# Patient Record
Sex: Male | Born: 1990 | Race: Black or African American | Hispanic: No | Marital: Single | Smoking: Former smoker
Health system: Southern US, Community
[De-identification: ages and names within clinical notes are randomized; demographics above are authoritative.]

## PROBLEM LIST (undated history)

## (undated) ENCOUNTER — Emergency Department (HOSPITAL_COMMUNITY): Admission: EM | Discharge status: Absent without leave | Payer: Self-pay

---
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
## 2010-07-27 ENCOUNTER — Ambulatory Visit: Admit: 2010-07-27 | Payer: Self-pay | Admitting: Sports Medicine

## 2012-12-05 ENCOUNTER — Emergency Department (HOSPITAL_COMMUNITY)
Admission: EM | Admit: 2012-12-05 | Discharge: 2012-12-06 | Disposition: A | Discharge status: Home / Self Care | Payer: Self-pay | Attending: Emergency Medicine | Admitting: Emergency Medicine

## 2012-12-05 DIAGNOSIS — J3489 Other specified disorders of nose and nasal sinuses: Secondary | ICD-10-CM | POA: Insufficient documentation

## 2012-12-05 DIAGNOSIS — J302 Other seasonal allergic rhinitis: Secondary | ICD-10-CM

## 2012-12-05 DIAGNOSIS — J309 Allergic rhinitis, unspecified: Secondary | ICD-10-CM | POA: Insufficient documentation

## 2012-12-05 DIAGNOSIS — Z880 Allergy status to penicillin: Secondary | ICD-10-CM | POA: Insufficient documentation

## 2012-12-06 ENCOUNTER — Encounter (HOSPITAL_COMMUNITY): Payer: Self-pay | Admitting: Emergency Medicine

## 2012-12-06 MED ORDER — CETIRIZINE HCL 10 MG PO TABS: 10.0000 mg | ORAL_TABLET | Freq: Every day | ORAL | Status: DC

## 2012-12-06 NOTE — ED Notes (Signed)
Pt states that he began having neck pain 2 weeks ago. Believes it may be a muscle strain. States that he has used heat and aspirin and that helps for awhile.

## 2012-12-06 NOTE — ED Provider Notes (Signed)
History     CSN: [id number]  Arrival date & time 12/05/12  2315   First MD Initiated Contact with Patient 12/06/12 0020      Chief Complaint  Patient presents with  . Neck Pain    (Consider location/radiation/quality/duration/timing/severity/associated sxs/prior treatment) HPI Comments: Patient is a 22 year old male who presents with a 2 week history of sinus congestion and neck pain. Symptoms started gradually and progressively worsened since the onset. Patient reports having a history of seasonal allergies but does not take any medication for them. Patient's neck pain is aching and moderate without radiation. Movement of his neck makes the pain worse. Patient reports the neck pain started when patient started working out. No other associated symptoms. Patient tries aspirin which helped with the pain.   Patient is a 22 y.o. male presenting with neck pain.  Neck Pain   History reviewed. No pertinent past medical history.  History reviewed. No pertinent past surgical history.  No family history on file.  History  Substance Use Topics  . Smoking status: Former Smoker    Quit date: 12/07/2010  . Smokeless tobacco: Never Used  . Alcohol Use: 3.6 oz/week    6 Cans of beer per week      Review of Systems  HENT: Positive for congestion and neck pain.   All other systems reviewed and are negative.    Allergies  Amoxicillin  Home Medications  No current outpatient prescriptions on file.  BP 119/89  Pulse 58  Temp(Src) 98.3 F (36.8 C) (Oral)  Resp 16  Ht 6\' 1"  (1.854 m)  Wt 133 lb 6.4 oz (60.51 kg)  BMI 17.6 kg/m2  SpO2 100%  Physical Exam  Nursing note and vitals reviewed. Constitutional: He is oriented to person, place, and time. He appears well-developed and well-nourished. No distress.  HENT:  Head: Normocephalic and atraumatic.  Mouth/Throat: Oropharynx is clear and moist. No oropharyngeal exudate.  No maxillary, frontal or ethmoid sinus tenderness to  palpation.   Eyes: Conjunctivae and EOM are normal. Pupils are equal, round, and reactive to light.  Neck: Normal range of motion.  Cardiovascular: Normal rate and regular rhythm.  Exam reveals no gallop and no friction rub.   No murmur heard. Pulmonary/Chest: Effort normal and breath sounds normal. He has no wheezes. He has no rales. He exhibits no tenderness.  Abdominal: Soft. There is no tenderness.  Musculoskeletal: Normal range of motion.  Neurological: He is alert and oriented to person, place, and time. Coordination normal.  Speech is goal-oriented. Moves limbs without ataxia.   Skin: Skin is warm and dry.  Psychiatric: He has a normal mood and affect. His behavior is normal.    ED Course  Procedures (including critical care time)  Labs Reviewed - No data to display No results found.   1. Seasonal allergies       MDM  12:37 AM Patient likely has nasal congestion from seasonal allergies. Patient is afebrile and denies any other symptoms. Vitals stable. Patient will have zyrtec prescription. Patient instructed to return with worsening or concerning symptoms.   [employer], PA-C 12/06/12 0100

## 2012-12-09 NOTE — ED Provider Notes (Signed)
Medical screening examination/treatment/procedure(s) were performed by non-physician practitioner and as supervising physician I was immediately available for consultation/collaboration.  Christopher J. Pollina, MD 12/09/12 0732 

## 2014-01-06 ENCOUNTER — Encounter (HOSPITAL_COMMUNITY): Payer: Self-pay | Admitting: Emergency Medicine

## 2014-01-06 ENCOUNTER — Emergency Department (HOSPITAL_COMMUNITY)
Admission: EM | Admit: 2014-01-06 | Discharge: 2014-01-06 | Disposition: A | Discharge status: Home / Self Care | Payer: Self-pay | Attending: Emergency Medicine | Admitting: Emergency Medicine

## 2014-01-06 ENCOUNTER — Emergency Department (HOSPITAL_COMMUNITY): Payer: Self-pay

## 2014-01-06 DIAGNOSIS — R059 Cough, unspecified: Secondary | ICD-10-CM | POA: Insufficient documentation

## 2014-01-06 DIAGNOSIS — R109 Unspecified abdominal pain: Secondary | ICD-10-CM | POA: Insufficient documentation

## 2014-01-06 DIAGNOSIS — Z87891 Personal history of nicotine dependence: Secondary | ICD-10-CM | POA: Insufficient documentation

## 2014-01-06 DIAGNOSIS — R079 Chest pain, unspecified: Secondary | ICD-10-CM

## 2014-01-06 DIAGNOSIS — R05 Cough: Secondary | ICD-10-CM | POA: Insufficient documentation

## 2014-01-06 DIAGNOSIS — R0789 Other chest pain: Secondary | ICD-10-CM | POA: Insufficient documentation

## 2014-01-06 DIAGNOSIS — Z880 Allergy status to penicillin: Secondary | ICD-10-CM | POA: Insufficient documentation

## 2014-01-06 LAB — CBC WITH DIFFERENTIAL/PLATELET
Basophils Absolute: 0.1 10*3/uL (ref 0.0–0.1)
Basophils Relative: 1 % (ref 0–1)
Eosinophils Absolute: 0.1 10*3/uL (ref 0.0–0.7)
Eosinophils Relative: 2 % (ref 0–5)
HCT: 44.4 % (ref 39.0–52.0)
Hemoglobin: 15 g/dL (ref 13.0–17.0)
LYMPHS PCT: 42 % (ref 12–46)
Lymphs Abs: 2.6 10*3/uL (ref 0.7–4.0)
MCH: 29 pg (ref 26.0–34.0)
MCHC: 33.8 g/dL (ref 30.0–36.0)
MCV: 85.7 fL (ref 78.0–100.0)
Monocytes Absolute: 0.4 10*3/uL (ref 0.1–1.0)
Monocytes Relative: 6 % (ref 3–12)
NEUTROS PCT: 49 % (ref 43–77)
Neutro Abs: 3 10*3/uL (ref 1.7–7.7)
PLATELETS: 196 10*3/uL (ref 150–400)
RBC: 5.18 MIL/uL (ref 4.22–5.81)
RDW: 13.6 % (ref 11.5–15.5)
WBC: 6.1 10*3/uL (ref 4.0–10.5)

## 2014-01-06 LAB — COMPREHENSIVE METABOLIC PANEL
ALT: 10 U/L (ref 0–53)
AST: 19 U/L (ref 0–37)
Albumin: 4.3 g/dL (ref 3.5–5.2)
Alkaline Phosphatase: 87 U/L (ref 39–117)
BUN: 8 mg/dL (ref 6–23)
CHLORIDE: 101 meq/L (ref 96–112)
CO2: 23 meq/L (ref 19–32)
Calcium: 9.5 mg/dL (ref 8.4–10.5)
Creatinine, Ser: 0.92 mg/dL (ref 0.50–1.35)
GFR calc Af Amer: >90 mL/min (ref >90)
Glucose, Bld: 81 mg/dL (ref 70–99)
POTASSIUM: 4 meq/L (ref 3.7–5.3)
SODIUM: 138 meq/L (ref 137–147)
Total Bilirubin: 0.5 mg/dL (ref 0.3–1.2)
Total Protein: 7.1 g/dL (ref 6.0–8.3)

## 2014-01-06 LAB — LIPASE, BLOOD: Lipase: 38 U/L (ref 11–59)

## 2014-01-06 MED ORDER — IBUPROFEN 600 MG PO TABS: 600.0000 mg | ORAL_TABLET | Freq: Four times a day (QID) | ORAL | Status: DC | PRN

## 2014-01-06 MED ORDER — IBUPROFEN 200 MG PO TABS
600.0000 mg | ORAL_TABLET | Freq: Once | ORAL | Status: AC
  Administered 2014-01-06: 600 mg via ORAL
  Filled 2014-01-06: qty 3

## 2014-01-06 NOTE — ED Notes (Signed)
Pt states he started having upper abd pain last night after eating a bowl of hot oatmeal and then today he is having a tightness in his chest that comes and goes  Pt states the pain is under his rib cage mainly on the left side  Pt describes it as a tightness  Pt denies any other sxs associated with the pain  No nausea or vomiting

## 2014-01-06 NOTE — Progress Notes (Signed)
  CARE MANAGEMENT ED NOTE 01/06/2014  Patient:  Victor [name] [name],Victor [name] [name]   Account Number:  [id number]  Date Initiated:  01/06/2014  Documentation initiated by:  Radford PaxFERRERO,AMY  Subjective/Objective Assessment:   patient presents to Ed with abdominal pain and chest tightness.     Subjective/Objective Assessment Detail:     Action/Plan:   Action/Plan Detail:   Anticipated DC Date:       Status Recommendation to Physician:   Result of Recommendation:    Other ED Services  Consult Working Plan    DC Planning Services  Other  PCP issues    Choice offered to / List presented to:            Status of service:  Completed, signed off  ED Comments:   ED Comments Detail:  EDCM spoke to patient at bedside.  Patient confirms he does not have [name] pcp or [place]. [facility] provided [facility] with [name] list of pcps who accept self pay patients, list of discount pharmacies and websites needymeds.org and Good https://figueroa.info/X.com for medication assistance, list of financial resources in the community such as local churches and [employer],  urban ministries, phone number ot inquire about the orange card and DSS for Medicaid, and dental assistance for patients without insurance.  [facility]. [facility] informed patient that walkins are welcome there from Mon-Thurs from 9-1030am .  Also informed patient that he may enroll for [facility] and receive assist with his medications if needed.  Patient thankful for resources. No further EDCM needs at this time.

## 2014-01-06 NOTE — Discharge Instructions (Signed)

## 2014-01-06 NOTE — ED Provider Notes (Signed)
CSN: [id number]     Arrival date & time 01/06/14  1947 History   First MD Initiated Contact with Patient 01/06/14 2047     Chief Complaint  Patient presents with  . Chest Pain     (Consider location/radiation/quality/duration/timing/severity/associated sxs/prior Treatment) HPI 23 year old male presents with left-sided chest pain intermittently for the past month. He states that today he also noticed left upper abdominal pain that is worse with motion and twisting  The chest pain steadily worse when lying flat. It is worse with deep inspiration. It is a sharp and cramping pain. Has not tried anything for the pain. Has a cough but states he's always had a cough. Denies any hemoptysis, leg swelling, DVT history, or recent travel. No exertional symptoms. No nausea or vomiting.  History reviewed. No pertinent past medical history. History reviewed. No pertinent past surgical history. History reviewed. No pertinent family history. History  Substance Use Topics  . Smoking status: Former Smoker    Quit date: 12/07/2010  . Smokeless tobacco: Never Used  . Alcohol Use: No    Review of Systems  Constitutional: Negative for fever.  Respiratory: Positive for cough (non productive - chronic). Negative for shortness of breath.   Cardiovascular: Positive for chest pain. Negative for leg swelling.  Gastrointestinal: Positive for abdominal pain. Negative for vomiting.  All other systems reviewed and are negative.     Allergies  Amoxicillin  Home Medications   Prior to Admission medications   Medication Sig Start Date End Date Taking? Authorizing Provider  ibuprofen (ADVIL,MOTRIN) 200 MG tablet Take 200 mg by mouth every 6 (six) hours as needed for moderate pain.   Yes Historical Provider, MD  Multiple Vitamin (MULTIVITAMIN WITH MINERALS) TABS tablet Take 1 tablet by mouth daily.   Yes Historical Provider, MD   BP 128/65  Pulse 74  Temp(Src) 97.8 F (36.6 C) (Oral)  Resp 13  Ht 6'  (1.829 m)  Wt 133 lb (60.328 kg)  BMI 18.03 kg/m2  SpO2 100% Physical Exam  Nursing note and vitals reviewed. Constitutional: He is oriented to person, place, and time. He appears well-developed and well-nourished. No distress.  HENT:  Head: Normocephalic and atraumatic.  Right Ear: External ear normal.  Left Ear: External ear normal.  Nose: Nose normal.  Eyes: Right eye exhibits no discharge. Left eye exhibits no discharge.  Neck: Neck supple.  Cardiovascular: Normal rate, regular rhythm, normal heart sounds and intact distal pulses.   Pulmonary/Chest: Effort normal and breath sounds normal. He has no wheezes. He has no rales. He exhibits no tenderness.  Abdominal: Soft. He exhibits no distension. There is no tenderness.  Musculoskeletal: He exhibits no edema.  Neurological: He is alert and oriented to person, place, and time.  Skin: Skin is warm and dry.    ED Course  Procedures (including critical care time) Labs Review Labs Reviewed  CBC WITH DIFFERENTIAL  COMPREHENSIVE METABOLIC PANEL  LIPASE, BLOOD    Imaging Review Dg Chest 2 View  01/06/2014   CLINICAL DATA:  Chest pain and shortness of breath.  EXAM: CHEST  2 VIEW  COMPARISON:  None.  FINDINGS: Pulmonary hyperinflation, likely due to asthma. The heart size and mediastinal contours are within normal limits. Both lungs are clear. The visualized skeletal structures are unremarkable.  IMPRESSION: No active cardiopulmonary disease.   Electronically Signed   By: Burman NievesWilliam  Stevens M.D.   On: 01/06/2014 21:28     EKG Interpretation   Date/Time:  Wednesday January 06 2014 20:02:21  EDT Ventricular Rate:  84 PR Interval:  124 QRS Duration: 95 QT Interval:  342 QTC Calculation: 404 R Axis:   -3 Text Interpretation:  Sinus rhythm RAE, consider biatrial enlargement RSR'  in V1 or V2, probably normal variant ST elevation suggests acute  pericarditis ST elevation appears similar to 2009 Confirmed by GOLDSTON   MD, SCOTT ([id number])  on 01/06/2014 8:50:21 PM      MDM   Final diagnoses:  Chest pain, unspecified chest pain type    Patient's pain is very atypical, his symptoms resolved completely with one dose of ibuprofen in the ED. Patient is low risk for pulmonary embolism and has a negative PERC. Not c/w ACS. Abdominal pain is of unk etiology, no abd tenderness now and normal labs. Will treat with NSAIDs and recommend PCP f/u.    Audree CamelScott T Goldston, MD 01/07/14 [phone]

## 2014-01-06 NOTE — ED Notes (Signed)
Patient transported to X-ray 

## 2014-10-08 ENCOUNTER — Emergency Department (HOSPITAL_COMMUNITY): Payer: Self-pay

## 2014-10-08 ENCOUNTER — Encounter (HOSPITAL_COMMUNITY): Payer: Self-pay | Admitting: Emergency Medicine

## 2014-10-08 ENCOUNTER — Emergency Department (HOSPITAL_COMMUNITY)
Admission: EM | Admit: 2014-10-08 | Discharge: 2014-10-08 | Disposition: A | Discharge status: Home / Self Care | Payer: Self-pay | Attending: Emergency Medicine | Admitting: Emergency Medicine

## 2014-10-08 DIAGNOSIS — Z880 Allergy status to penicillin: Secondary | ICD-10-CM | POA: Insufficient documentation

## 2014-10-08 DIAGNOSIS — R079 Chest pain, unspecified: Secondary | ICD-10-CM | POA: Insufficient documentation

## 2014-10-08 DIAGNOSIS — Z79899 Other long term (current) drug therapy: Secondary | ICD-10-CM | POA: Insufficient documentation

## 2014-10-08 DIAGNOSIS — R109 Unspecified abdominal pain: Secondary | ICD-10-CM | POA: Insufficient documentation

## 2014-10-08 DIAGNOSIS — Z87891 Personal history of nicotine dependence: Secondary | ICD-10-CM | POA: Insufficient documentation

## 2014-10-08 LAB — CBC
HEMATOCRIT: 42.6 % (ref 39.0–52.0)
Hemoglobin: 14.4 g/dL (ref 13.0–17.0)
MCH: 29.3 pg (ref 26.0–34.0)
MCHC: 33.8 g/dL (ref 30.0–36.0)
MCV: 86.6 fL (ref 78.0–100.0)
Platelets: 195 10*3/uL (ref 150–400)
RBC: 4.92 MIL/uL (ref 4.22–5.81)
RDW: 13.6 % (ref 11.5–15.5)
WBC: 5 10*3/uL (ref 4.0–10.5)

## 2014-10-08 LAB — BASIC METABOLIC PANEL
Anion gap: 6 (ref 5–15)
BUN: 8 mg/dL (ref 6–23)
CO2: 27 mmol/L (ref 19–32)
Calcium: 9.3 mg/dL (ref 8.4–10.5)
Chloride: 106 mmol/L (ref 96–112)
Creatinine, Ser: 0.96 mg/dL (ref 0.50–1.35)
GFR calc non Af Amer: >90 mL/min (ref >90)
Glucose, Bld: 87 mg/dL (ref 70–99)
POTASSIUM: 3.7 mmol/L (ref 3.5–5.1)
SODIUM: 139 mmol/L (ref 135–145)

## 2014-10-08 LAB — I-STAT TROPONIN, ED: TROPONIN I, POC: 0.01 ng/mL (ref 0.00–0.08)

## 2014-10-08 MED ORDER — OMEPRAZOLE 20 MG PO CPDR: 20.0000 mg | DELAYED_RELEASE_CAPSULE | Freq: Every day | ORAL | Status: DC

## 2014-10-08 NOTE — Progress Notes (Signed)
24 [place] c/o intermittent fatigue x 3 days, abdominal pain, neck pain, chest pain, neck pain. Denies emesis or diarrhea.  No pcp Spoke with pt who confirmed no pcp  CM spoke with pt who [place] pcp. CM discussed and provided written information for self pay pcps, importance of pcp for f/u care, www.needymeds.org, www.goodrx.com, discounted pharmacies and other [place] resources such as [facility], [facility], affordable care act,  financial assistance, DSS and  health department  Reviewed resources for [place] self pay pcps like [facility], [facility] medicine at [address], [facility], general medical clinics, [facility] plus others, medication resources, [facility] out patient [facility]ies and housing Pt voiced understanding and appreciation of resources provided   Provided [facility] contact information Agreed to referral CM completed Referral

## 2014-10-08 NOTE — Discharge Instructions (Signed)

## 2014-10-08 NOTE — ED Notes (Signed)
Pt c/o intermittent fatigue x 3 days, abdominal pain, neck pain, chest pain, neck pain. Denies emesis or diarrhea.

## 2014-10-08 NOTE — ED Provider Notes (Signed)
CSN: [id number]     Arrival date & time 10/08/14  1516 History   First MD Initiated Contact with Patient 10/08/14 1551     Chief Complaint  Patient presents with  . Fatigue  . Chest Pain  . Abdominal Pain     (Consider location/radiation/quality/duration/timing/severity/associated sxs/prior Treatment) Patient is a 24 y.o. male presenting with chest pain and abdominal pain. The history is provided by the patient.  Chest Pain Associated symptoms: abdominal pain   Associated symptoms: no back pain, no headache, no nausea, no numbness, no shortness of breath, not vomiting and no weakness   Abdominal Pain Associated symptoms: chest pain   Associated symptoms: no diarrhea, no dysuria, no nausea, no shortness of breath and no vomiting    patient presents with multiple complaints. States he has pain in his upper chest and shoulders. Is worse with sitting up. It is worse after eating. He states will have the taste of acid in his throat sometimes. No fevers. States his been going on for last few days. Also will have some lower abdominal pain at times. No weight loss or weight gain. He does not smoke cigarettes but does smoke marijuana. States it may have been worse since he has had a new strain over the last few days. States he also feels foggy headed at time.  History reviewed. No pertinent past medical history. History reviewed. No pertinent past surgical history. No family history on file. History  Substance Use Topics  . Smoking status: Former Smoker    Quit date: 12/07/2010  . Smokeless tobacco: Never Used  . Alcohol Use: No    Review of Systems  Constitutional: Negative for activity change and appetite change.  Eyes: Negative for pain.  Respiratory: Negative for chest tightness and shortness of breath.   Cardiovascular: Positive for chest pain. Negative for leg swelling.  Gastrointestinal: Positive for abdominal pain. Negative for nausea, vomiting and diarrhea.  Genitourinary:  Negative for dysuria, flank pain, penile swelling, penile pain and testicular pain.  Musculoskeletal: Negative for back pain and neck stiffness.  Skin: Negative for rash.  Neurological: Negative for weakness, numbness and headaches.  Psychiatric/Behavioral: Negative for behavioral problems.      Allergies  Amoxicillin  Home Medications   Prior to Admission medications   Medication Sig Start Date End Date Taking? Authorizing Provider  loratadine (CLARITIN) 10 MG tablet Take 10 mg by mouth daily as needed for allergies (allergies).   Yes Historical Provider, MD  Multiple Vitamin (MULTIVITAMIN WITH MINERALS) TABS tablet Take 1 tablet by mouth daily.   Yes Historical Provider, MD  ibuprofen (ADVIL,MOTRIN) 600 MG tablet Take 1 tablet (600 mg total) by mouth every 6 (six) hours as needed. Patient not taking: Reported on 10/08/2014 01/06/14   Pricilla LovelessScott Goldston, MD  omeprazole (PRILOSEC) 20 MG capsule Take 1 capsule (20 mg total) by mouth daily. 10/08/14   Benjiman CoreNathan Vihaan Gloss, MD   BP 121/64 mmHg  Pulse 64  Temp(Src) 97.9 F (36.6 C) (Oral)  Resp 18  SpO2 98% Physical Exam  Constitutional: He is oriented to person, place, and time. He appears well-developed and well-nourished.  HENT:  Head: Normocephalic and atraumatic.  Eyes: Pupils are equal, round, and reactive to light.  Neck: Normal range of motion. Neck supple.  Cardiovascular: Normal rate, regular rhythm and normal heart sounds.   No murmur heard. Pulmonary/Chest: Effort normal.  Abdominal: Soft. Bowel sounds are normal. He exhibits no distension. There is no tenderness.  Musculoskeletal: Normal range of motion. He exhibits  no edema.  Neurological: He is alert and oriented to person, place, and time. No cranial nerve deficit.  Skin: Skin is warm and dry.  Psychiatric: He has a normal mood and affect.  Nursing note and vitals reviewed.   ED Course  Procedures (including critical care time) Labs Review Labs Reviewed  CBC  BASIC  METABOLIC PANEL  Rosezena Sensor, ED    Imaging Review Dg Chest 2 View  10/08/2014   CLINICAL DATA:  Chest pain, fatigue  EXAM: CHEST  2 VIEW  COMPARISON:  01/06/2014  FINDINGS: Cardiomediastinal silhouette is stable. No acute infiltrate or pleural effusion. No pulmonary edema. Bony thorax is unremarkable.  IMPRESSION: No active cardiopulmonary disease.   Electronically Signed   By: Natasha Mead M.D.   On: 10/08/2014 16:21     EKG Interpretation   Date/Time:  Friday October 08 2014 15:28:23 EDT Ventricular Rate:  67 PR Interval:  129 QRS Duration: 101 QT Interval:  378 QTC Calculation: 399 R Axis:   49 Text Interpretation:  Sinus rhythm RSR' in V1 or V2, right VCD or RVH  Nonspecific T abnrm, anterolateral leads ST elev, probable normal early  repol pattern Confirmed by Rubin Payor  MD, Harrold Donath [phone]) on 10/08/2014  4:10:26 PM      MDM   Final diagnoses:  Chest pain, unspecified chest pain type    Patient with chest pain. Also abdominal pain. Vague symptoms. EKG and lab work reassuring. Doubt cardiac cause. Has had some acid taste in his throat that has improved with Tums at home. Will start on Prilosec and have follow-up with a PCP as needed.    Benjiman Core, MD 10/08/14 1758

## 2017-03-17 ENCOUNTER — Emergency Department (HOSPITAL_COMMUNITY)
Admission: EM | Admit: 2017-03-17 | Discharge: 2017-03-18 | Disposition: A | Discharge status: Home / Self Care | Payer: Self-pay | Attending: Emergency Medicine | Admitting: Emergency Medicine

## 2017-03-17 ENCOUNTER — Encounter (HOSPITAL_COMMUNITY): Payer: Self-pay | Admitting: *Deleted

## 2017-03-17 DIAGNOSIS — R112 Nausea with vomiting, unspecified: Secondary | ICD-10-CM | POA: Insufficient documentation

## 2017-03-17 DIAGNOSIS — R1013 Epigastric pain: Secondary | ICD-10-CM | POA: Insufficient documentation

## 2017-03-17 DIAGNOSIS — Z87891 Personal history of nicotine dependence: Secondary | ICD-10-CM | POA: Insufficient documentation

## 2017-03-17 LAB — URINALYSIS, ROUTINE W REFLEX MICROSCOPIC
Bacteria, UA: NONE SEEN
Bilirubin Urine: NEGATIVE
Glucose, UA: NEGATIVE mg/dL
Hgb urine dipstick: NEGATIVE
Ketones, ur: 80 mg/dL — AB
Leukocytes, UA: NEGATIVE
Nitrite: NEGATIVE
Protein, ur: 30 mg/dL — AB
Specific Gravity, Urine: 1.03 (ref 1.005–1.030)
Squamous Epithelial / LPF: NONE SEEN
pH: 5 (ref 5.0–8.0)

## 2017-03-17 LAB — RAPID URINE DRUG SCREEN, HOSP PERFORMED
Amphetamines: NOT DETECTED
BARBITURATES: NOT DETECTED
BENZODIAZEPINES: NOT DETECTED
Cocaine: POSITIVE — AB
Opiates: NOT DETECTED
Tetrahydrocannabinol: POSITIVE — AB

## 2017-03-17 LAB — COMPREHENSIVE METABOLIC PANEL
ALT: 14 U/L — ABNORMAL LOW (ref 17–63)
AST: 28 U/L (ref 15–41)
Albumin: 4.9 g/dL (ref 3.5–5.0)
Alkaline Phosphatase: 74 U/L (ref 38–126)
Anion gap: 10 (ref 5–15)
BUN: 11 mg/dL (ref 6–20)
CO2: 26 mmol/L (ref 22–32)
Calcium: 9.8 mg/dL (ref 8.9–10.3)
Chloride: 103 mmol/L (ref 101–111)
Creatinine, Ser: 1.07 mg/dL (ref 0.61–1.24)
GFR calc Af Amer: >60 mL/min (ref >60)
GFR calc non Af Amer: >60 mL/min (ref >60)
Glucose, Bld: 85 mg/dL (ref 65–99)
Potassium: 4 mmol/L (ref 3.5–5.1)
Sodium: 139 mmol/L (ref 135–145)
Total Bilirubin: 1.2 mg/dL (ref 0.3–1.2)
Total Protein: 7.9 g/dL (ref 6.5–8.1)

## 2017-03-17 LAB — CBC
HCT: 45 % (ref 39.0–52.0)
Hemoglobin: 15.1 g/dL (ref 13.0–17.0)
MCH: 28.9 pg (ref 26.0–34.0)
MCHC: 33.6 g/dL (ref 30.0–36.0)
MCV: 86.2 fL (ref 78.0–100.0)
Platelets: 217 10*3/uL (ref 150–400)
RBC: 5.22 MIL/uL (ref 4.22–5.81)
RDW: 15 % (ref 11.5–15.5)
WBC: 6 10*3/uL (ref 4.0–10.5)

## 2017-03-17 LAB — LIPASE, BLOOD: Lipase: 19 U/L (ref 11–51)

## 2017-03-17 MED ORDER — ONDANSETRON HCL 4 MG/2ML IJ SOLN
4.0000 mg | Freq: Once | INTRAMUSCULAR | Status: AC
  Administered 2017-03-17: 4 mg via INTRAVENOUS
  Filled 2017-03-17: qty 2

## 2017-03-17 MED ORDER — ONDANSETRON 8 MG PO TBDP: 8.0000 mg | ORAL_TABLET | Freq: Three times a day (TID) | ORAL | 0 refills | Status: DC | PRN

## 2017-03-17 MED ORDER — ALUM & MAG HYDROXIDE-SIMETH 200-200-20 MG/5ML PO SUSP
15.0000 mL | Freq: Once | ORAL | Status: AC
  Administered 2017-03-17: 15 mL via ORAL
  Filled 2017-03-17: qty 30

## 2017-03-17 MED ORDER — SODIUM CHLORIDE 0.9 % IV BOLUS (SEPSIS)
1000.0000 mL | Freq: Once | INTRAVENOUS | Status: AC
  Administered 2017-03-17: 1000 mL via INTRAVENOUS

## 2017-03-17 MED ORDER — FAMOTIDINE 20 MG PO TABS
20.0000 mg | ORAL_TABLET | Freq: Once | ORAL | Status: AC
  Administered 2017-03-17: 20 mg via ORAL
  Filled 2017-03-17: qty 1

## 2017-03-17 NOTE — ED Notes (Signed)
Pt does smoke THC, pt attempted to smoke this morning for N/V, pt stated it made the n/v worse. Pt occasionally passes gas, pts last bowel movement this morning and was abnormal, movement came out in small amounts.

## 2017-03-17 NOTE — ED Notes (Signed)
Pt unable to provide urine sample at this time 

## 2017-03-17 NOTE — ED Triage Notes (Signed)
Pt states for last 2 weeks he has had a loss of appetite and nausea. Unable to keep food or water in but able to consume and keep some ETOH yesterday, tried marijuana today for nausea without relief.

## 2017-03-17 NOTE — ED Notes (Signed)
Pt is unable to urinate at this time. 

## 2017-03-17 NOTE — ED Provider Notes (Signed)
[facility]-EMERGENCY DEPT Provider Note   CSN: [id number] Arrival date & time: 03/17/17  1603     History   Chief Complaint Chief Complaint  Patient presents with  . Anorexia    HPI Victor [name] is a 26 y.o. male.  Patient c/o epigastric pain, and nausea/vomiting, intermittently in past 2 weeks. Emesis clear to color recently ingested food, not bloody or bilious. Epigastric pain comes and goes, no specific exacerbating or alleviating factors. Denies back or flank pain. Is having normal bms. Denies hx pud, pancreatitis, or gallstones. Denies dysuria, hematuria or gu c/o. No chest pain or sob. No fever or chills.    The history is provided by the patient.    History reviewed. No pertinent past medical history.  There are no active problems to display for this patient.   History reviewed. No pertinent surgical history.     Home Medications    Prior to Admission medications   Medication Sig Start Date End Date Taking? Authorizing Provider  hydroxypropyl methylcellulose / hypromellose (ISOPTO TEARS / GONIOVISC) 2.5 % ophthalmic solution Place 1 drop into both eyes as needed for dry eyes.   Yes [provider]  ibuprofen (ADVIL,MOTRIN) 600 MG tablet Take 1 tablet (600 mg total) by mouth every 6 (six) hours as needed. Patient not taking: Reported on 10/08/2014 01/06/14   Pricilla Loveless, MD    Family History No family history on file.  Social History Social History  Substance Use Topics  . Smoking status: Former Smoker    Quit date: 12/07/2010  . Smokeless tobacco: Never Used  . Alcohol use Yes     Allergies   Amoxicillin   Review of Systems Review of Systems  Constitutional: Negative for fever.  HENT: Negative for sore throat.   Eyes: Negative for redness.  Respiratory: Negative for shortness of breath.   Cardiovascular: Negative for chest pain.  Gastrointestinal: Positive for abdominal pain and vomiting.  Endocrine: Negative for polyuria.    Genitourinary: Negative for dysuria and flank pain.  Musculoskeletal: Negative for back pain and neck pain.  Skin: Negative for rash.  Neurological: Negative for headaches.  Hematological: Does not bruise/bleed easily.  Psychiatric/Behavioral: Negative for confusion.     Physical Exam Updated Vital Signs BP 114/73 (BP Location: Left Arm)   Pulse (!) 55   Temp 99.3 F (37.4 C) (Oral)   Resp 18   Ht 1.829 m (6')   Wt 61.7 kg (136 lb)   SpO2 100%   BMI 18.44 kg/m   Physical Exam  Constitutional: He appears well-developed and well-nourished. No distress.  HENT:  Mouth/Throat: Oropharynx is clear and moist.  Eyes: Conjunctivae are normal.  Neck: Neck supple. No tracheal deviation present.  Cardiovascular: Normal rate, regular rhythm, normal heart sounds and intact distal pulses.  Exam reveals no gallop and no friction rub.   No murmur heard. Pulmonary/Chest: Effort normal and breath sounds normal. No accessory muscle usage. No respiratory distress.  Abdominal: Soft. Bowel sounds are normal. He exhibits no distension and no mass. There is no tenderness. There is no rebound and no guarding. No hernia.  Genitourinary:  Genitourinary Comments: No cva tenderness  Musculoskeletal: He exhibits no edema.  Neurological: He is alert.  Skin: Skin is warm and dry. No rash noted. He is not diaphoretic.  Psychiatric: He has a normal mood and affect.  Nursing note and vitals reviewed.    ED Treatments / Results  Labs (all labs ordered are listed, but only abnormal results are  displayed) Results for orders placed or performed during the hospital encounter of 03/17/17  Lipase, blood  Result Value Ref Range   Lipase 19 11 - 51 U/L  Comprehensive metabolic panel  Result Value Ref Range   Sodium 139 135 - 145 mmol/L   Potassium 4.0 3.5 - 5.1 mmol/L   Chloride 103 101 - 111 mmol/L   CO2 26 22 - 32 mmol/L   Glucose, Bld 85 65 - 99 mg/dL   BUN 11 6 - 20 mg/dL   Creatinine, Ser 1.611.07  0.61 - 1.24 mg/dL   Calcium 9.8 8.9 - 09.610.3 mg/dL   Total Protein 7.9 6.5 - 8.1 g/dL   Albumin 4.9 3.5 - 5.0 g/dL   AST 28 15 - 41 U/L   ALT 14 (L) 17 - 63 U/L   Alkaline Phosphatase 74 38 - 126 U/L   Total Bilirubin 1.2 0.3 - 1.2 mg/dL   GFR calc non Af Amer >60 >60 mL/min   GFR calc Af Amer >60 >60 mL/min   Anion gap 10 5 - 15  CBC  Result Value Ref Range   WBC 6.0 4.0 - 10.5 K/uL   RBC 5.22 4.22 - 5.81 MIL/uL   Hemoglobin 15.1 13.0 - 17.0 g/dL   HCT 04.545.0 40.939.0 - 81.152.0 %   MCV 86.2 78.0 - 100.0 fL   MCH 28.9 26.0 - 34.0 pg   MCHC 33.6 30.0 - 36.0 g/dL   RDW 91.415.0 78.211.5 - 95.615.5 %   Platelets 217 150 - 400 K/uL    EKG  EKG Interpretation None       Radiology No results found.  Procedures Procedures (including critical care time)  Medications Ordered in ED Medications  sodium chloride 0.9 % bolus 1,000 mL (not administered)  famotidine (PEPCID) tablet 20 mg (not administered)  alum & mag hydroxide-simeth (MAALOX/MYLANTA) 200-200-20 MG/5ML suspension 15 mL (not administered)  ondansetron (ZOFRAN) injection 4 mg (not administered)     Initial Impression / Assessment and Plan / ED Course  I have reviewed the triage vital signs and the nursing notes.  Pertinent labs & imaging results that were available during my care of the patient were reviewed by me and considered in my medical decision making (see chart for details).  Iv ns bolus. zofran iv.  Labs.   pepcid and maalox po.  Reviewed nursing notes and prior charts for additional history.   Additional ns iv and po.  Recheck pt, abd pain resolved.  abd soft nt..  No recurrent emesis.  Pt appears stable for d/c.     Final Clinical Impressions(s) / ED Diagnoses   Final diagnoses:  None    New Prescriptions New Prescriptions   No medications on file     Cathren LaineSteinl, Rosezetta Balderston, MD 03/17/17 2329

## 2017-03-17 NOTE — Discharge Instructions (Signed)
It was our pleasure to provide your ER care today - we hope that you feel better.  Take pepcid and maalox as need for symptom relief.  You may also take zofran as need for nausea.  Please note that marijuana use can cause a recurrent upper abdominal pain and vomiting syndrome.   If recurrent vomiting and abdominal pain, avoid marijuana use.   Follow up with primary care doctor in 1 week.  Return to ER if worse, new symptoms, new or severe pain, persistent vomiting, fevers, other concern.

## 2017-05-29 ENCOUNTER — Other Ambulatory Visit: Payer: Self-pay

## 2017-05-29 ENCOUNTER — Emergency Department (HOSPITAL_COMMUNITY): Payer: Worker's Compensation

## 2017-05-29 ENCOUNTER — Encounter (HOSPITAL_COMMUNITY): Payer: Self-pay | Admitting: Emergency Medicine

## 2017-05-29 DIAGNOSIS — M79641 Pain in right hand: Secondary | ICD-10-CM | POA: Diagnosis present

## 2017-05-29 DIAGNOSIS — Z5321 Procedure and treatment not carried out due to patient leaving prior to being seen by health care provider: Secondary | ICD-10-CM | POA: Diagnosis not present

## 2017-05-29 NOTE — ED Triage Notes (Signed)
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

## 2017-05-30 ENCOUNTER — Emergency Department (HOSPITAL_COMMUNITY)
Admission: EM | Admit: 2017-05-30 | Discharge: 2017-05-30 | Disposition: A | Discharge status: Home / Self Care | Payer: Worker's Compensation | Attending: Emergency Medicine | Admitting: Emergency Medicine

## 2017-05-30 NOTE — ED Notes (Signed)
Called  No response from lobby 

## 2018-06-10 ENCOUNTER — Encounter (HOSPITAL_COMMUNITY): Payer: Self-pay

## 2018-06-10 ENCOUNTER — Emergency Department (HOSPITAL_COMMUNITY)
Admission: EM | Admit: 2018-06-10 | Discharge: 2018-06-10 | Disposition: A | Discharge status: Home / Self Care | Payer: Self-pay | Attending: Emergency Medicine | Admitting: Emergency Medicine

## 2018-06-10 DIAGNOSIS — G478 Other sleep disorders: Secondary | ICD-10-CM | POA: Insufficient documentation

## 2018-06-10 DIAGNOSIS — Z79899 Other long term (current) drug therapy: Secondary | ICD-10-CM | POA: Insufficient documentation

## 2018-06-10 DIAGNOSIS — F419 Anxiety disorder, unspecified: Secondary | ICD-10-CM | POA: Insufficient documentation

## 2018-06-10 DIAGNOSIS — F17200 Nicotine dependence, unspecified, uncomplicated: Secondary | ICD-10-CM | POA: Insufficient documentation

## 2018-06-10 DIAGNOSIS — G479 Sleep disorder, unspecified: Secondary | ICD-10-CM

## 2018-06-10 MED ORDER — MELATONIN 3 MG PO CAPS: 1.0000 | ORAL_CAPSULE | Freq: Every evening | ORAL | 0 refills | Status: AC | PRN

## 2018-06-10 MED ORDER — HYDROXYZINE HCL 25 MG PO TABS: 25.0000 mg | ORAL_TABLET | Freq: Four times a day (QID) | ORAL | 0 refills | Status: DC

## 2018-06-10 NOTE — ED Provider Notes (Signed)
[facility]-EMERGENCY DEPT Provider Note   CSN: [id number] Arrival date & time: 06/10/18  1147     History   Chief Complaint Chief Complaint  Patient presents with  . Anxiety    HPI Victor [name] is a 27 y.o. male presents for evaluation of gradual onset, intermittent palpitations, difficulty sleeping, and feelings of anxiety for 3 weeks.  He reports that he has felt his heart rate speeding up occasionally which will last briefly and then resolve.  He denies shortness of breath, chest tightness, chest pain, abdominal pain, vomiting, fevers.  He does note some nausea and feeling of heartburn but reports he has a history of reflux and has been taking medications and modifying his diet for this with improvement.  He states that he has difficulty getting to sleep and staying asleep.  Yesterday he states that while scrolling through social media he was "triggered "by seeing a post from an acquaintance last year "that I had been getting into all kinds of trouble and doing a lot of drugs with ".  He states that he has been feeling anxious since then.  He denies suicidal ideation, homicidal ideation, or auditory or visual hallucinations.  He occasionally smokes marijuana, denies alcohol use or cigarette smoking.  He recently cut out caffeine.  He has seen a counselor in the past with some improvement.  Denies any symptoms at this time.  The history is provided by the patient.    History reviewed. No pertinent past medical history.  There are no active problems to display for this patient.   History reviewed. No pertinent surgical history.      Home Medications    Prior to Admission medications   Medication Sig Start Date End Date Taking? Authorizing Provider  hydroxypropyl methylcellulose / hypromellose (ISOPTO TEARS / GONIOVISC) 2.5 % ophthalmic solution Place 1 drop into both eyes as needed for dry eyes.    [provider]  hydrOXYzine  (ATARAX/VISTARIL) 25 MG tablet Take 1 tablet (25 mg total) by mouth every 6 (six) hours. 06/10/18   Dalynn Jhaveri A, PA-C  ibuprofen (ADVIL,MOTRIN) 600 MG tablet Take 1 tablet (600 mg total) by mouth every 6 (six) hours as needed. Patient not taking: Reported on 10/08/2014 01/06/14   Pricilla LovelessGoldston, Scott, MD  Melatonin 3 MG CAPS Take 1 capsule (3 mg total) by mouth at bedtime as needed (sleep). 06/10/18   Tyrisha Benninger A, PA-C  ondansetron (ZOFRAN ODT) 8 MG disintegrating tablet Take 1 tablet (8 mg total) by mouth every 8 (eight) hours as needed for nausea or vomiting. 03/17/17   Cathren LaineSteinl, Kevin, MD    Family History History reviewed. No pertinent family history.  Social History Social History   Tobacco Use  . Smoking status: Current Every Day Smoker  . Smokeless tobacco: Never Used  Substance Use Topics  . Alcohol use: No    Frequency: Never  . Drug use: No     Allergies   Amoxicillin   Review of Systems Review of Systems  Respiratory: Negative for shortness of breath.   Cardiovascular: Positive for palpitations. Negative for chest pain and leg swelling.  Gastrointestinal: Positive for nausea. Negative for abdominal pain and vomiting.  Psychiatric/Behavioral: Negative for dysphoric mood and suicidal ideas. The patient is nervous/anxious.      Physical Exam Updated Vital Signs BP (!) 156/98 (BP Location: Right Arm)   Pulse 96   Temp 97.8 F (36.6 C) (Oral)   Resp 18   Ht 6' (1.829  m)   SpO2 100%   BMI 18.44 kg/m   Physical Exam  Constitutional: He appears well-developed and well-nourished. No distress.  Resting comfortably in chair, makes poor eye contact  HENT:  Head: Normocephalic and atraumatic.  Posterior pharynx with no tonsillar hypertrophy, exudates, erythema, uvular deviation, trismus, or sublingual abnormalities.  Eyes: Conjunctivae are normal. Right eye exhibits no discharge. Left eye exhibits no discharge.  Neck: Normal range of motion. Neck supple. No JVD present.  No tracheal deviation present.  Cardiovascular: Normal rate, regular rhythm, normal heart sounds and intact distal pulses.  Pulmonary/Chest: Effort normal and breath sounds normal. No respiratory distress.  Abdominal: Soft. Bowel sounds are normal. He exhibits no distension. There is no tenderness. There is no guarding.  Musculoskeletal: He exhibits no edema.  Neurological: He is alert.  Skin: Skin is warm and dry. No erythema.  Psychiatric: His speech is normal and behavior is normal. His mood appears anxious. He is not actively hallucinating. He expresses no homicidal and no suicidal ideation. He expresses no suicidal plans and no homicidal plans.  Nursing note and vitals reviewed.    ED Treatments / Results  Labs (all labs ordered are listed, but only abnormal results are displayed) Labs Reviewed - No data to display  EKG None  Radiology No results found.  Procedures Procedures (including critical care time)  Medications Ordered in ED Medications - No data to display   Initial Impression / Assessment and Plan / ED Course  I have reviewed the triage vital signs and the nursing notes.  Pertinent labs & imaging results that were available during my care of the patient were reviewed by me and considered in my medical decision making (see chart for details).     Patient with feelings of anxiety, palpitations, nausea.  He is afebrile, mildly hypertensive.  Informed him of this and recommend follow-up with PCP if this persists.  He is resting comfortably in chair, appears mildly anxious.  Denies palpitations, heart regular rate and rhythm on my examination.  He is nontoxic in appearance.  Reassuring physical examination.  I did offer him EKG, baseline labs, and TSH for further evaluation but he refuses this and feels his symptoms are likely related to acid reflux and anxiety.  I think this is reasonable given his reassuring appearance.  Doubt ACS/MI, PE, or acute surgical abdominal  pathology.  I do not feel that he is a danger to himself or others at this time and he denies suicidal ideation or homicidal ideation.  Discussed sleep hygiene, dietary modifications.  Will discharge with hydroxyzine and melatonin.  Discussed appropriate use of these medications.  Recommend follow-up with PCP and a counselor for reevaluation.  Discussed strict ED return precautions. Pt verbalized understanding of and agreement with plan and is safe for discharge home at this time.  No complaints prior to discharge.  Final Clinical Impressions(s) / ED Diagnoses   Final diagnoses:  Anxiety  Difficulty sleeping    ED Discharge Orders         Ordered    hydrOXYzine (ATARAX/VISTARIL) 25 MG tablet  Every 6 hours     06/10/18 1425    Melatonin 3 MG CAPS  At bedtime PRN     06/10/18 1425           Alyssa Rotondo, [place] A, PA-C 06/10/18 1427    Jacalyn Lefevre, MD 06/10/18 1527

## 2018-06-10 NOTE — ED Triage Notes (Signed)
Pt presents with c/o anxiety, hx of same. Pt reports he has been looking for a job and dealing with family and has recently quit smoking marijuana.

## 2018-06-10 NOTE — Discharge Instructions (Signed)
Start taking hydroxyzine as prescribed as needed for anxiety.  This medication may make you drowsy.  You can also try to take this at night to help with sleep.  You can cut these tablets in half if they are too strong.  You can also take melatonin to help you get to sleep.  I recommend trying these medications separately to see which is most helpful but you can also take them together.  Drink plenty of water and get plenty of rest.  Avoid fried foods, spicy foods, acidic foods, or alcohol as these can make some of your acid reflux symptoms worse.  Can also elevate the head of the bed.  Return to the emergency department if any concerning signs or symptoms develop such as persistently elevated heart rate, high fevers, shortness of breath, passing out, or chest pains.

## 2018-08-10 IMAGING — CR DG HAND COMPLETE 3+V*R*
4 series · 4 of 4 positions shown · non-contrast
Comparison: None.

CLINICAL DATA: Crush injury

EXAM:
RIGHT HAND - COMPLETE 3+ VIEW

[x hand pa right]
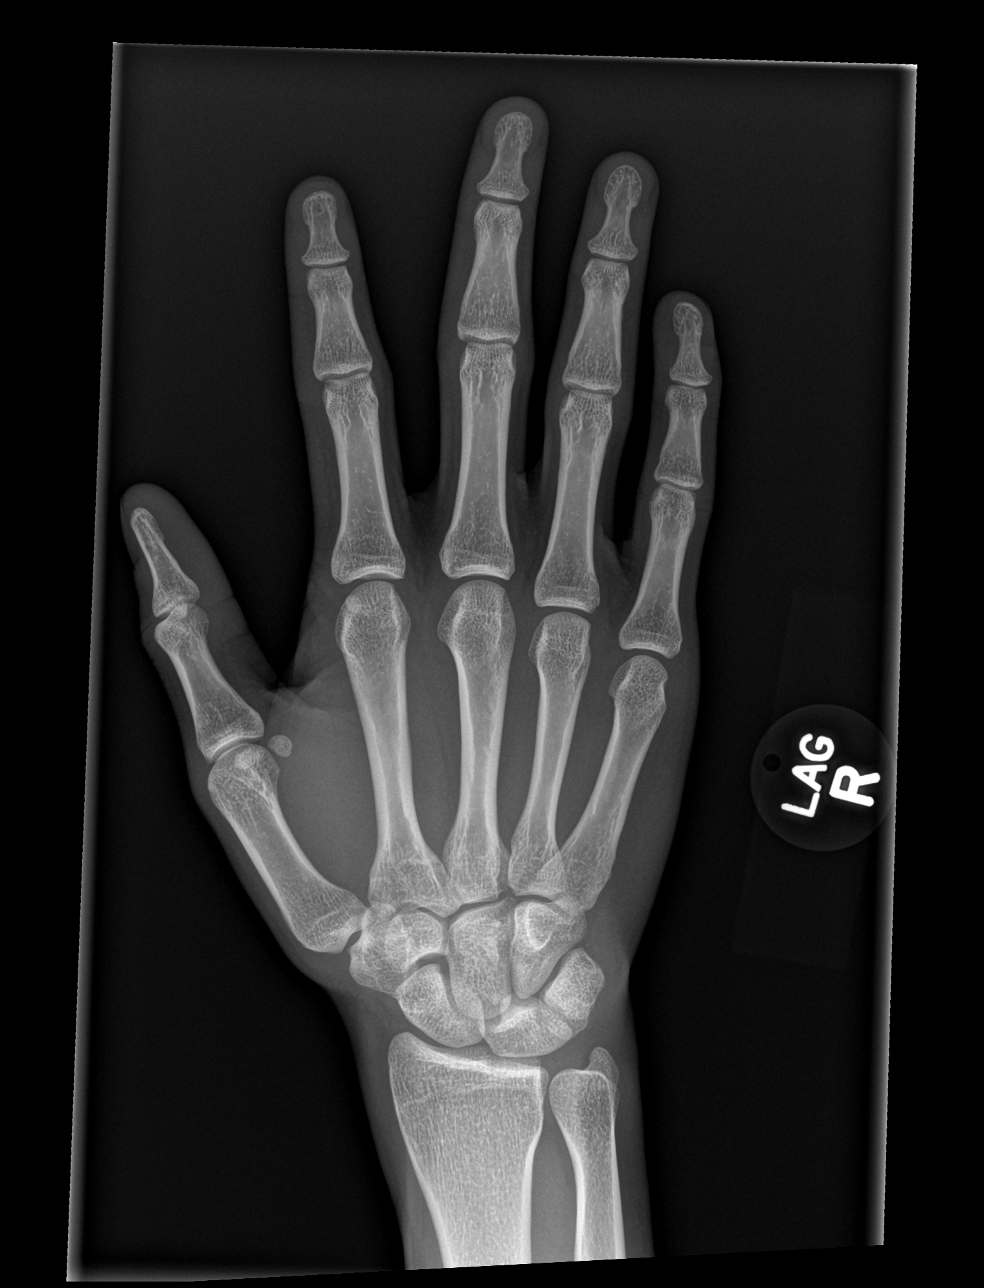

[x hand obl right]
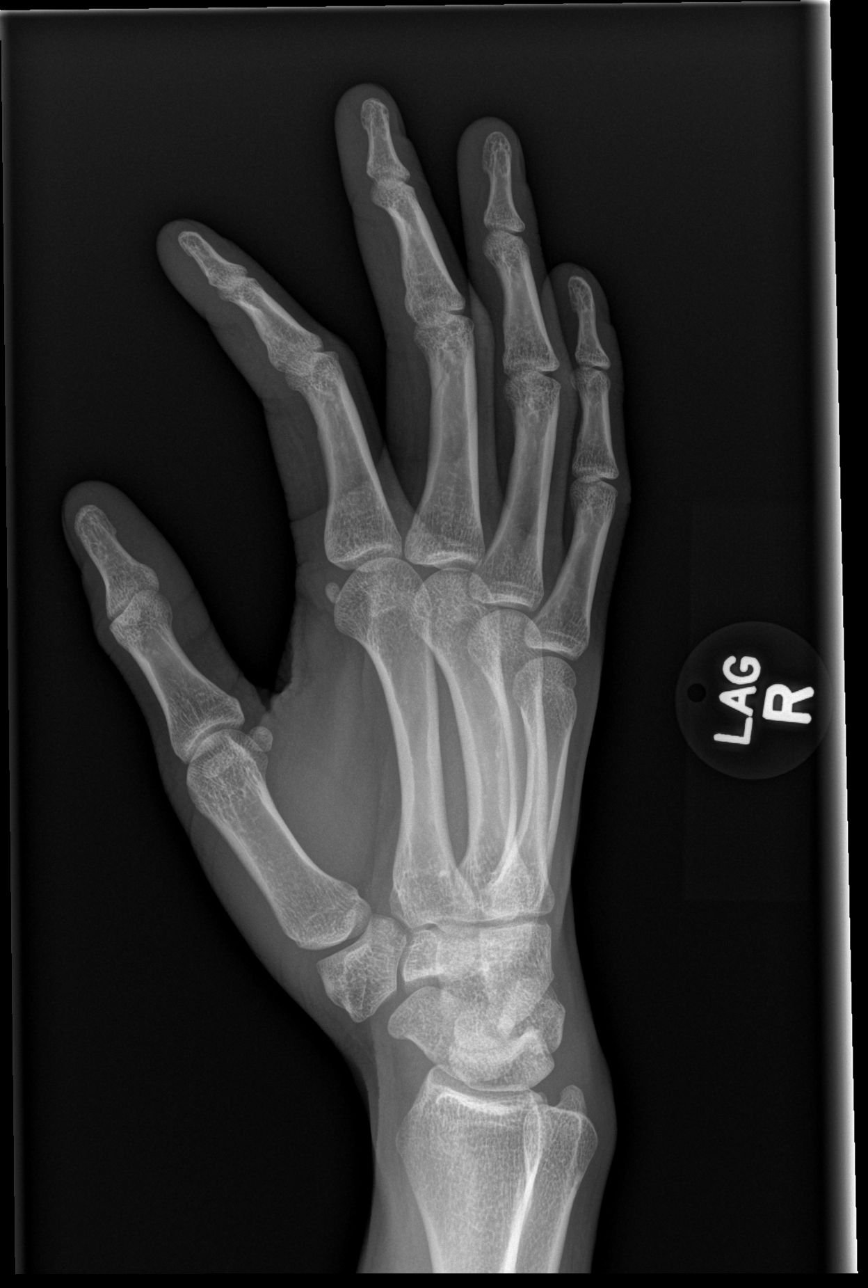

[x hand lat right (1 of 2)]
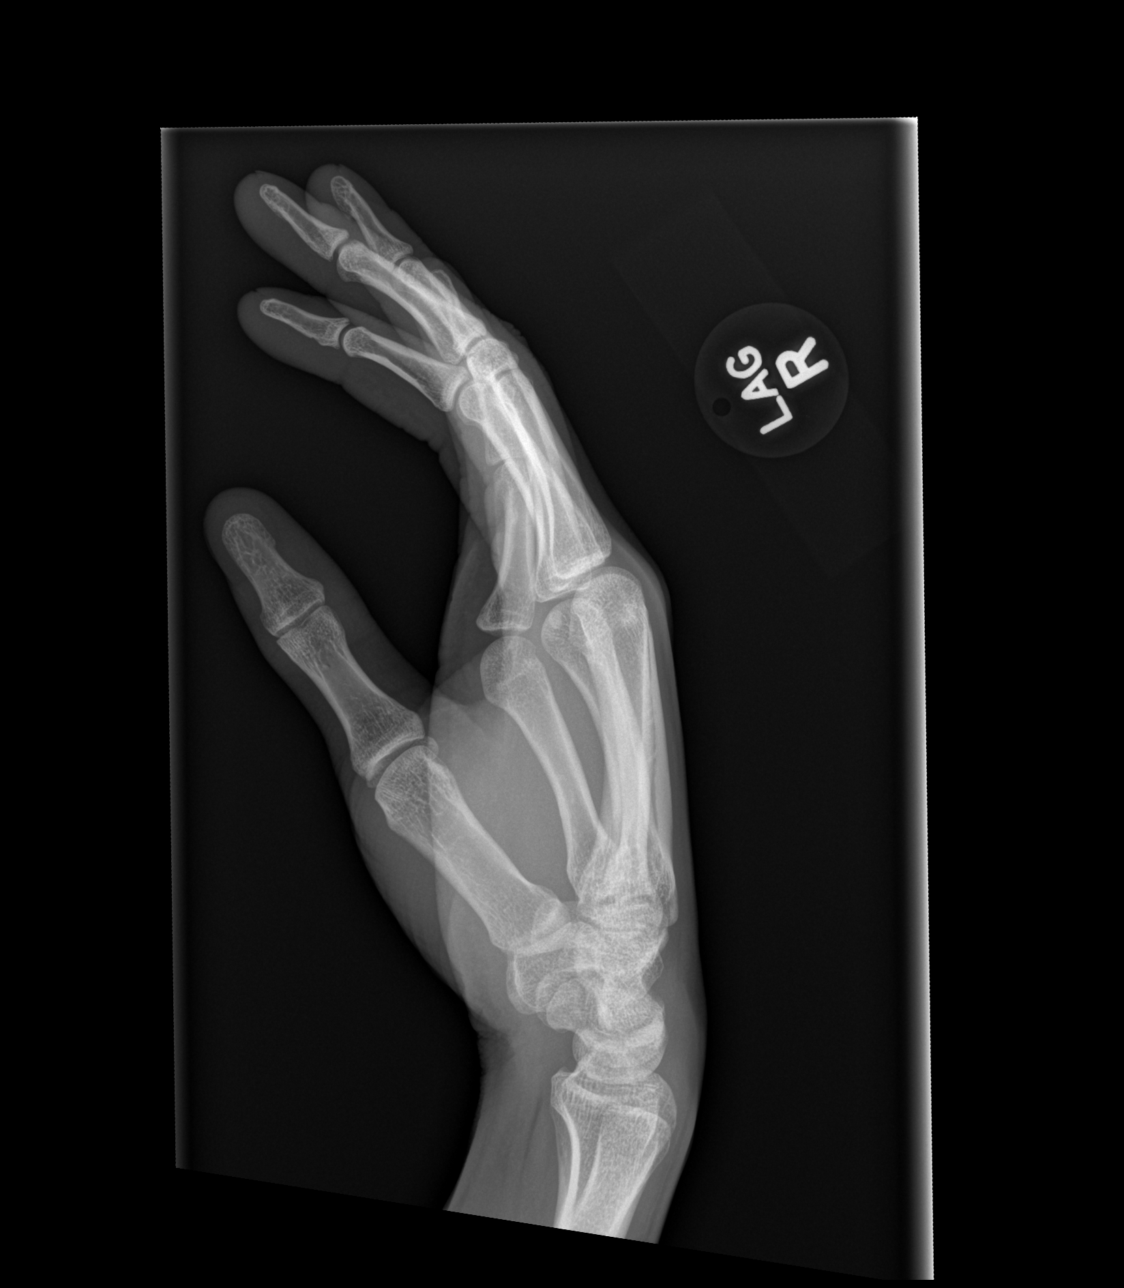

[x hand lat right (2 of 2)]
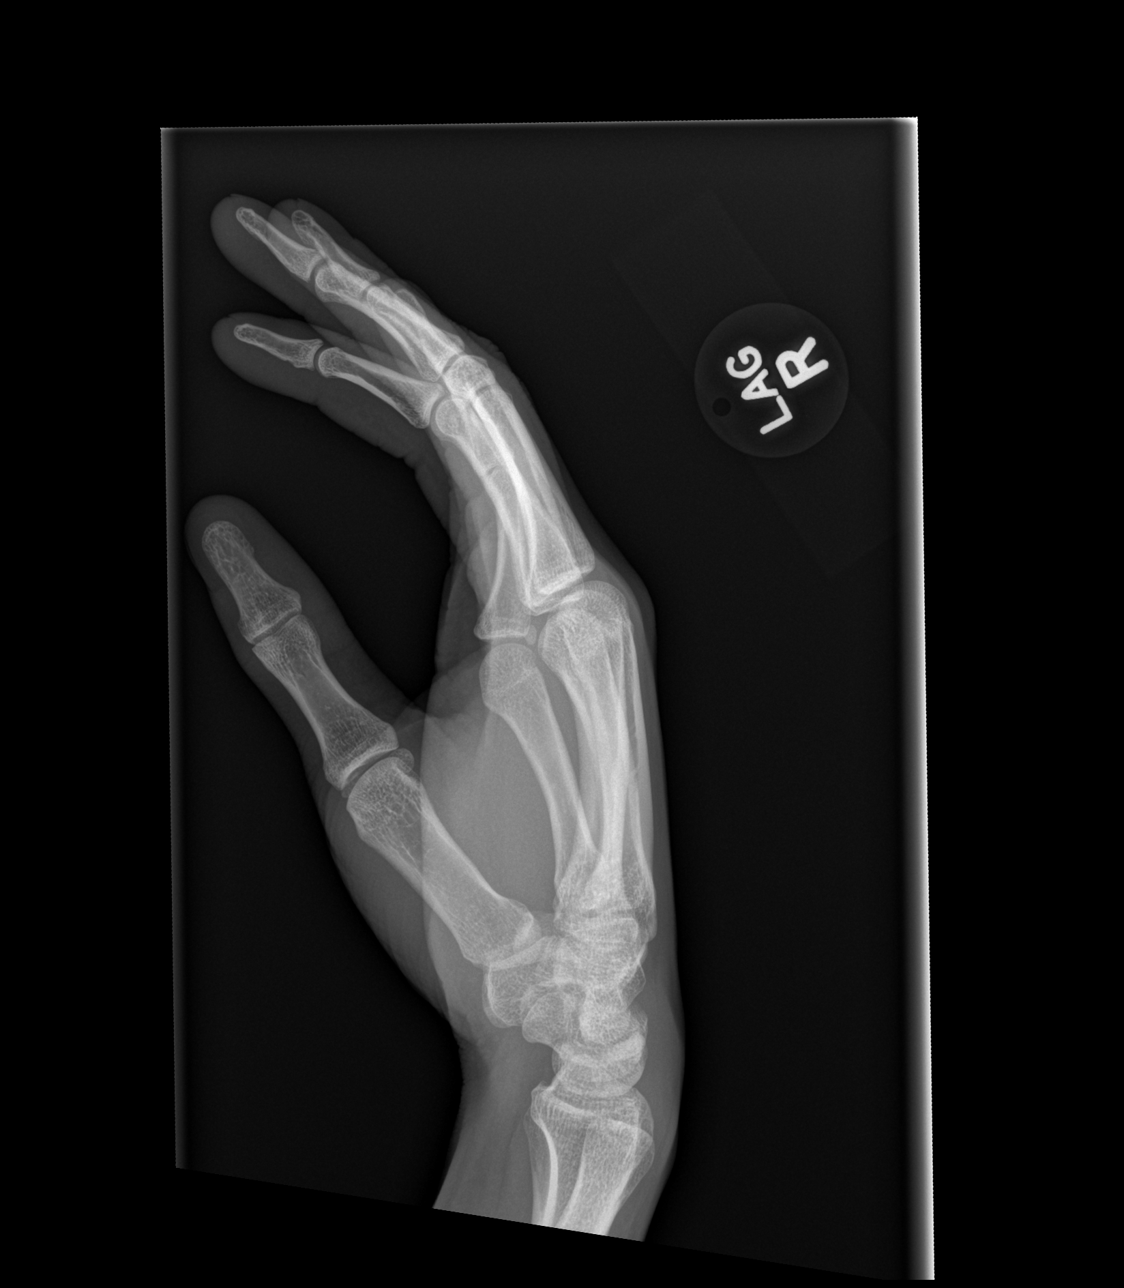

[4 of 4 positions shown; findings below may reference images not displayed]

FINDINGS: There is no evidence of fracture or dislocation. There is no
evidence of arthropathy or other focal bone abnormality. Soft
tissues are unremarkable.
IMPRESSION: Negative.

## 2019-03-30 ENCOUNTER — Encounter (HOSPITAL_COMMUNITY): Payer: Self-pay

## 2019-03-30 ENCOUNTER — Emergency Department (HOSPITAL_COMMUNITY)
Admission: EM | Admit: 2019-03-30 | Discharge: 2019-03-30 | Disposition: A | Discharge status: Home / Self Care | Payer: Self-pay | Attending: Emergency Medicine | Admitting: Emergency Medicine

## 2019-03-30 ENCOUNTER — Other Ambulatory Visit: Payer: Self-pay

## 2019-03-30 DIAGNOSIS — F17210 Nicotine dependence, cigarettes, uncomplicated: Secondary | ICD-10-CM | POA: Insufficient documentation

## 2019-03-30 DIAGNOSIS — Z20828 Contact with and (suspected) exposure to other viral communicable diseases: Secondary | ICD-10-CM | POA: Insufficient documentation

## 2019-03-30 DIAGNOSIS — R059 Cough, unspecified: Secondary | ICD-10-CM

## 2019-03-30 DIAGNOSIS — R0981 Nasal congestion: Secondary | ICD-10-CM | POA: Insufficient documentation

## 2019-03-30 DIAGNOSIS — R05 Cough: Secondary | ICD-10-CM

## 2019-03-30 LAB — SARS CORONAVIRUS 2 (TAT 6-24 HRS): SARS Coronavirus 2: NEGATIVE

## 2019-03-30 NOTE — ED Provider Notes (Signed)
[facility]-EMERGENCY DEPT Provider Note   CSN: [id number] Arrival date & time: 03/30/19  1225     History   Chief Complaint Chief Complaint  Patient presents with   Cough   Nasal Congestion    HPI Victor [name] is a 28 y.o. male.     HPI 28 year old African-American male with no pertinent past medical history presents the ER for evaluation of productive cough, nasal congestion, sinus pressure, intermittent subjective fevers and chills.  No known sick contacts.  Symptoms started 4 days ago.  Patient reports productive cough with green sputum.  He reports some intermittent sore throat.  Patient states he is been having subjective fevers at home but has not measured his temperature.  Patient believes that it was allergy related initially.  He has been taking Mucinex for his chest congestion with some relief.  No alleviating or aggravating factors.  Denies any diarrhea, nausea or vomiting. History reviewed. No pertinent past medical history.  There are no active problems to display for this patient.   History reviewed. No pertinent surgical history.      Home Medications    Prior to Admission medications   Medication Sig Start Date End Date Taking? Authorizing Provider  hydroxypropyl methylcellulose / hypromellose (ISOPTO TEARS / GONIOVISC) 2.5 % ophthalmic solution Place 1 drop into both eyes as needed for dry eyes.    [provider]  hydrOXYzine (ATARAX/VISTARIL) 25 MG tablet Take 1 tablet (25 mg total) by mouth every 6 (six) hours. 06/10/18   Fawze, Mina A, PA-C  ibuprofen (ADVIL,MOTRIN) 600 MG tablet Take 1 tablet (600 mg total) by mouth every 6 (six) hours as needed. Patient not taking: Reported on 10/08/2014 01/06/14   Pricilla LovelessGoldston, Scott, MD  Melatonin 3 MG CAPS Take 1 capsule (3 mg total) by mouth at bedtime as needed (sleep). 06/10/18   Fawze, Mina A, PA-C  ondansetron (ZOFRAN ODT) 8 MG disintegrating tablet Take 1 tablet (8 mg total)  by mouth every 8 (eight) hours as needed for nausea or vomiting. 03/17/17   Cathren LaineSteinl, Kevin, MD    Family History History reviewed. No pertinent family history.  Social History Social History   Tobacco Use   Smoking status: Current Every Day Smoker   Smokeless tobacco: Never Used  Substance Use Topics   Alcohol use: No    Frequency: Never   Drug use: No     Allergies   Amoxicillin   Review of Systems Review of Systems  Constitutional: Positive for chills and fever.  HENT: Positive for congestion, rhinorrhea, sinus pressure, sinus pain, sneezing and sore throat.   Eyes: Negative for discharge.  Respiratory: Positive for cough. Negative for shortness of breath and wheezing.   Cardiovascular: Negative for chest pain.  Gastrointestinal: Negative for diarrhea, nausea and vomiting.  Musculoskeletal: Negative for myalgias.  Skin: Negative for rash.  Neurological: Negative for headaches.  Psychiatric/Behavioral: Negative for confusion.     Physical Exam Updated Vital Signs BP 120/75 (BP Location: Right Arm)    Pulse 66    Temp 98.4 F (36.9 C) (Oral)    Resp 16    Ht 6' (1.829 m)    SpO2 100%    BMI 18.44 kg/m   Physical Exam Vitals signs and nursing note reviewed.  Constitutional:      General: He is not in acute distress.    Appearance: He is well-developed. He is not ill-appearing or toxic-appearing.  HENT:     Head: Normocephalic and atraumatic.  Right Ear: Tympanic membrane, ear canal and external ear normal.     Left Ear: Tympanic membrane, ear canal and external ear normal.     Nose: Congestion and rhinorrhea present.     Comments: Sinus pressure to palpation.    Mouth/Throat:     Mouth: Mucous membranes are moist.     Pharynx: Oropharynx is clear. No oropharyngeal exudate or posterior oropharyngeal erythema.     Comments: Oropharynx is clear.  No exudates, swelling or erythema noted.  Uvula is midline. Eyes:     General: No scleral icterus.       Right  eye: No discharge.        Left eye: No discharge.     Conjunctiva/sclera: Conjunctivae normal.  Neck:     Musculoskeletal: Normal range of motion. No neck rigidity.  Cardiovascular:     Rate and Rhythm: Normal rate and regular rhythm.     Heart sounds: Normal heart sounds.  Pulmonary:     Effort: Pulmonary effort is normal. No respiratory distress.     Breath sounds: Normal breath sounds. No stridor. No wheezing, rhonchi or rales.  Chest:     Chest wall: No tenderness.  Musculoskeletal: Normal range of motion.  Skin:    Coloration: Skin is not pale.  Neurological:     Mental Status: He is alert.  Psychiatric:        Behavior: Behavior normal.        Thought Content: Thought content normal.        Judgment: Judgment normal.      ED Treatments / Results  Labs (all labs ordered are listed, but only abnormal results are displayed) Labs Reviewed  SARS CORONAVIRUS 2 (TAT 6-24 HRS)    EKG None  Radiology No results found.  Procedures Procedures (including critical care time)  Medications Ordered in ED Medications - No data to display   Initial Impression / Assessment and Plan / ED Course  I have reviewed the triage vital signs and the nursing notes.  Pertinent labs & imaging results that were available during my care of the patient were reviewed by me and considered in my medical decision making (see chart for details).        Victor [name] was evaluated in Emergency Department on 03/30/2019 for the symptoms described in the history of present illness. He was evaluated in the context of the global COVID-19 pandemic, which necessitated consideration that the patient might be at risk for infection with the SARS-CoV-2 virus that causes COVID-19. Institutional protocols and algorithms that pertain to the evaluation of patients at risk for COVID-19 are in a state of rapid change based on information released by regulatory bodies including the CDC and federal and state  organizations. These policies and algorithms were followed during the patient's care in the ED.  28 year old male presents the ER for evaluation of sinus pressure, congestion, cough, sore throat and subjective fevers at home.  No known sick contacts.  On exam patient overall well-appearing and nontoxic.  Vital signs are very reassuring.  Lungs are clear to auscultation bilaterally.  Have low suspicion for pneumonia given normal lung exam and patient is not hypoxic.  Did offer chest x-ray but patient declined which I feel is reasonable.  No nuchal rigidity concerning for meningitis.  Oropharynx is clear and doubt strep pharyngitis.  No signs of otitis media.  Possible sinusitis however symptoms less than 10 days there is no indication of antibiotics.  Cannot rule out COVID-19  and test is pending at this time.  Discussed quarantine precautions with patient at home.  Discussed over-the-counter medication to help with his cough along with Motrin and Tylenol for fevers and pain.  Pt is hemodynamically stable, in NAD, & able to ambulate in the ED. Evaluation does not show pathology that would require ongoing emergent intervention or inpatient treatment. I explained the diagnosis to the patient. Pain has been managed & has no complaints prior to dc. Pt is comfortable with above plan and is stable for discharge at this time. All questions were answered prior to disposition. Strict return precautions for f/u to the ED were discussed. Encouraged follow up with PCP.   Final Clinical Impressions(s) / ED Diagnoses   Final diagnoses:  Nasal congestion  Cough    ED Discharge Orders    None       Wallace Keller 03/30/19 1417    Milagros Loll, MD 03/31/19 [phone]

## 2019-03-30 NOTE — Discharge Instructions (Addendum)
Your COVID-19 test is pending.  You can use over-the-counter cough medications or liquids make sure when you get these medications it contains dextromethorphan and guaifenesin.  These are both the decongestant and cough medication as well.  Motrin and Tylenol for any fevers, chills, body aches.  Return to the ER if you feel your symptoms worsen.       Person Under Monitoring Name: Victor [name]  Location: [address] [place] [address]   Infection Prevention Recommendations for Individuals Confirmed to have, or Being Evaluated for, 2019 Novel Coronavirus (COVID-19) Infection Who Receive Care at Home  Individuals who are confirmed to have, or are being evaluated for, COVID-19 should follow the prevention steps below until a healthcare provider or local or state health department says they can return to normal activities.  Stay home except to get medical care You should restrict activities outside your home, except for getting medical care. Do not go to work, school, or public areas, and do not use public transportation or taxis.  Call ahead before visiting your doctor Before your medical appointment, call the healthcare provider and tell them that you have, or are being evaluated for, COVID-19 infection. This will help the healthcare providers office take steps to keep other people from getting infected. Ask your healthcare provider to call the local or state health department.  Monitor your symptoms Seek prompt medical attention if your illness is worsening (e.g., difficulty breathing). Before going to your medical appointment, call the healthcare provider and tell them that you have, or are being evaluated for, COVID-19 infection. Ask your healthcare provider to call the local or state health department.  Wear a facemask You should wear a facemask that covers your nose and mouth when you are in the same room with other people and when you visit a healthcare provider.  People who live with or visit you should also wear a facemask while they are in the same room with you.  Separate yourself from other people in your home As much as possible, you should stay in a different room from other people in your home. Also, you should use a separate bathroom, if available.  Avoid sharing household items You should not share dishes, drinking glasses, cups, eating utensils, towels, bedding, or other items with other people in your home. After using these items, you should wash them thoroughly with soap and water.  Cover your coughs and sneezes Cover your mouth and nose with a tissue when you cough or sneeze, or you can cough or sneeze into your sleeve. Throw used tissues in a lined trash can, and immediately wash your hands with soap and water for at least 20 seconds or use an alcohol-based hand rub.  Wash your [employer] your hands often and thoroughly with soap and water for at least 20 seconds. You can use an alcohol-based hand sanitizer if soap and water are not available and if your hands are not visibly dirty. Avoid touching your eyes, nose, and mouth with unwashed hands.   Prevention Steps for Caregivers and Household Members of Individuals Confirmed to have, or Being Evaluated for, COVID-19 Infection Being Cared for in the Home  If you live with, or provide care at home for, a person confirmed to have, or being evaluated for, COVID-19 infection please follow these guidelines to prevent infection:  Follow healthcare providers instructions Make sure that you understand and can help the patient follow any healthcare provider instructions for all care.  Provide for the  patients basic needs You should help the patient with basic needs in the home and provide support for getting groceries, prescriptions, and other personal needs.  Monitor the patients symptoms If they are getting sicker, call his or her medical provider and tell them that the patient  has, or is being evaluated for, COVID-19 infection. This will help the healthcare providers office take steps to keep other people from getting infected. Ask the healthcare provider to call the local or state health department.  Limit the number of people who have contact with the patient If possible, have only one caregiver for the patient. Other household members should stay in another home or place of residence. If this is not possible, they should stay in another room, or be separated from the patient as much as possible. Use a separate bathroom, if available. Restrict visitors who do not have an essential need to be in the home.  Keep older adults, very young children, and other sick people away from the patient Keep older adults, very young children, and those who have compromised immune systems or chronic health conditions away from the patient. This includes people with chronic heart, lung, or kidney conditions, diabetes, and cancer.  Ensure good ventilation Make sure that shared spaces in the home have good air flow, such as from an air conditioner or an opened window, weather permitting.  Wash your hands often Wash your hands often and thoroughly with soap and water for at least 20 seconds. You can use an alcohol based hand sanitizer if soap and water are not available and if your hands are not visibly dirty. Avoid touching your eyes, nose, and mouth with unwashed hands. Use disposable paper towels to dry your hands. If not available, use dedicated cloth towels and replace them when they become wet.  Wear a facemask and gloves Wear a disposable facemask at all times in the room and gloves when you touch or have contact with the patients blood, body fluids, and/or secretions or excretions, such as sweat, saliva, sputum, nasal mucus, vomit, urine, or feces.  Ensure the mask fits over your nose and mouth tightly, and do not touch it during use. Throw out disposable facemasks and  gloves after using them. Do not reuse. Wash your hands immediately after removing your facemask and gloves. If your personal clothing becomes contaminated, carefully remove clothing and launder. Wash your hands after handling contaminated clothing. Place all used disposable facemasks, gloves, and other waste in a lined container before disposing them with other household waste. Remove gloves and wash your hands immediately after handling these items.  Do not share dishes, glasses, or other household items with the patient Avoid sharing household items. You should not share dishes, drinking glasses, cups, eating utensils, towels, bedding, or other items with a patient who is confirmed to have, or being evaluated for, COVID-19 infection. After the person uses these items, you should wash them thoroughly with soap and water.  Wash laundry thoroughly Immediately remove and wash clothes or bedding that have blood, body fluids, and/or secretions or excretions, such as sweat, saliva, sputum, nasal mucus, vomit, urine, or feces, on them. Wear gloves when handling laundry from the patient. Read and follow directions on labels of laundry or clothing items and detergent. In general, wash and dry with the warmest temperatures recommended on the label.  Clean all areas the individual has used often Clean all touchable surfaces, such as counters, tabletops, doorknobs, bathroom fixtures, toilets, phones, keyboards, tablets, and bedside tables,  every day. Also, clean any surfaces that may have blood, body fluids, and/or secretions or excretions on them. Wear gloves when cleaning surfaces the patient has come in contact with. Use a diluted bleach solution (e.g., dilute bleach with 1 part bleach and 10 parts water) or a household disinfectant with a label that says EPA-registered for coronaviruses. To make a bleach solution at home, add 1 tablespoon of bleach to 1 quart (4 cups) of water. For a larger supply, add   cup of bleach to 1 gallon (16 cups) of water. Read labels of cleaning products and follow recommendations provided on product labels. Labels contain instructions for safe and effective use of the cleaning product including precautions you should take when applying the product, such as wearing gloves or eye protection and making sure you have good ventilation during use of the product. Remove gloves and wash hands immediately after cleaning.  Monitor yourself for signs and symptoms of illness Caregivers and household members are considered close contacts, should monitor their health, and will be asked to limit movement outside of the home to the extent possible. Follow the monitoring steps for close contacts listed on the symptom monitoring form.   ? If you have additional questions, contact your local health department or call the epidemiologist on call at [phone] (available 24/7). ? This guidance is subject to change. For the most up-to-date guidance from [facility], please refer to their website: [email or web address]

## 2019-03-30 NOTE — ED Triage Notes (Signed)
Pt reports having allergy-like symptoms starting last Wednesday and states that it has progressed to a sore throat and slightly worsening cough. Pt denies fever, N/V/D.

## 2021-07-09 ENCOUNTER — Other Ambulatory Visit: Payer: Self-pay

## 2021-07-09 ENCOUNTER — Encounter (HOSPITAL_COMMUNITY): Payer: Self-pay | Admitting: Emergency Medicine

## 2021-07-09 ENCOUNTER — Emergency Department (HOSPITAL_COMMUNITY)
Admission: EM | Admit: 2021-07-09 | Discharge: 2021-07-09 | Disposition: A | Discharge status: Home / Self Care | Payer: Self-pay | Attending: Emergency Medicine | Admitting: Emergency Medicine

## 2021-07-09 DIAGNOSIS — K625 Hemorrhage of anus and rectum: Secondary | ICD-10-CM | POA: Insufficient documentation

## 2021-07-09 DIAGNOSIS — F17200 Nicotine dependence, unspecified, uncomplicated: Secondary | ICD-10-CM | POA: Insufficient documentation

## 2021-07-09 LAB — HEPATIC FUNCTION PANEL
ALT: 15 U/L (ref 0–44)
AST: 23 U/L (ref 15–41)
Albumin: 4.6 g/dL (ref 3.5–5.0)
Alkaline Phosphatase: 67 U/L (ref 38–126)
Bilirubin, Direct: 0.1 mg/dL (ref 0.0–0.2)
Indirect Bilirubin: 0.8 mg/dL (ref 0.3–0.9)
Total Bilirubin: 0.9 mg/dL (ref 0.3–1.2)
Total Protein: 8 g/dL (ref 6.5–8.1)

## 2021-07-09 LAB — CBC WITH DIFFERENTIAL/PLATELET
Abs Immature Granulocytes: 0 10*3/uL (ref 0.00–0.07)
Basophils Absolute: 0 10*3/uL (ref 0.0–0.1)
Basophils Relative: 1 %
Eosinophils Absolute: 0 10*3/uL (ref 0.0–0.5)
Eosinophils Relative: 1 %
HCT: 44.2 % (ref 39.0–52.0)
Hemoglobin: 14.3 g/dL (ref 13.0–17.0)
Immature Granulocytes: 0 %
Lymphocytes Relative: 57 %
Lymphs Abs: 1.8 10*3/uL (ref 0.7–4.0)
MCH: 28.4 pg (ref 26.0–34.0)
MCHC: 32.4 g/dL (ref 30.0–36.0)
MCV: 87.7 fL (ref 80.0–100.0)
Monocytes Absolute: 0.2 10*3/uL (ref 0.1–1.0)
Monocytes Relative: 7 %
Neutro Abs: 1.1 10*3/uL — ABNORMAL LOW (ref 1.7–7.7)
Neutrophils Relative %: 34 %
Platelet Morphology: NORMAL
Platelets: 213 10*3/uL (ref 150–400)
RBC: 5.04 MIL/uL (ref 4.22–5.81)
RDW: 14.2 % (ref 11.5–15.5)
WBC: 3.2 10*3/uL — ABNORMAL LOW (ref 4.0–10.5)
nRBC: 0 % (ref 0.0–0.2)

## 2021-07-09 LAB — BASIC METABOLIC PANEL
Anion gap: 6 (ref 5–15)
BUN: 12 mg/dL (ref 6–20)
CO2: 28 mmol/L (ref 22–32)
Calcium: 9.2 mg/dL (ref 8.9–10.3)
Chloride: 105 mmol/L (ref 98–111)
Creatinine, Ser: 0.79 mg/dL (ref 0.61–1.24)
GFR, Estimated: >60 mL/min (ref >60)
Glucose, Bld: 86 mg/dL (ref 70–99)
Potassium: 3.9 mmol/L (ref 3.5–5.1)
Sodium: 139 mmol/L (ref 135–145)

## 2021-07-09 LAB — TYPE AND SCREEN
ABO/RH(D): O POS
Antibody Screen: NEGATIVE

## 2021-07-09 LAB — APTT: aPTT: 36 seconds (ref 24–36)

## 2021-07-09 LAB — PROTIME-INR
INR: 1 (ref 0.8–1.2)
Prothrombin Time: 13.1 seconds (ref 11.4–15.2)

## 2021-07-09 LAB — POC OCCULT BLOOD, ED: Fecal Occult Bld: POSITIVE — AB

## 2021-07-09 NOTE — ED Triage Notes (Signed)
Reports blood in his stool today, had bright red blood in the water, occ some when he wipes. Thinks he might have some hemorrhoids.

## 2021-07-09 NOTE — ED Notes (Signed)
Pt stated IV was causing increase pain. IV was removed as noted.

## 2021-07-09 NOTE — Discharge Instructions (Addendum)
Your labs did not show anemia. Attached you will find information for the on-call gastroenterologist, you may follow-up with them as needed.  Attached are directions for increasing fiber diet.  Ensure to maintain fluid intake.  You may follow-up with your primary care provider as needed.  Return to the ED if you are experiencing increasing/worsening dizziness, or bleeding, or worsening symptoms.

## 2021-07-09 NOTE — ED Provider Notes (Signed)
[facility]-EMERGENCY DEPT Provider Note   CSN: [id number] Arrival date & time: 07/09/21  1004     History No chief complaint on file.   Victor [name] is a 30 y.o. male with no significant past medical history who presents to the ED complaining of blood in stool onset today.  He notes that he has had a poor diet and that he has been straining on the toilet. Pt notes that he has a history of hemorrhoids. He has tried mag citrate without relief of his symptoms. Denies abdominal pain, nausea, vomiting, diarrhea, rectal pain, fever, chills, constipation, dizziness, lightheadedness, dysuria, hematuria.   The history is provided by the patient. No language interpreter was used.      History reviewed. No pertinent past medical history.  There are no problems to display for this patient.   History reviewed. No pertinent surgical history.     No family history on file.  Social History   Tobacco Use   Smoking status: Every Day   Smokeless tobacco: Never  Substance Use Topics   Alcohol use: No   Drug use: No    Home Medications Prior to Admission medications   Medication Sig Start Date End Date Taking? Authorizing Provider  hydroxypropyl methylcellulose / hypromellose (ISOPTO TEARS / GONIOVISC) 2.5 % ophthalmic solution Place 1 drop into both eyes as needed for dry eyes.    [provider]  hydrOXYzine (ATARAX/VISTARIL) 25 MG tablet Take 1 tablet (25 mg total) by mouth every 6 (six) hours. 06/10/18   Fawze, Mina A, PA-C  ibuprofen (ADVIL,MOTRIN) 600 MG tablet Take 1 tablet (600 mg total) by mouth every 6 (six) hours as needed. Patient not taking: Reported on 10/08/2014 01/06/14   Pricilla Loveless, MD  Melatonin 3 MG CAPS Take 1 capsule (3 mg total) by mouth at bedtime as needed (sleep). 06/10/18   Fawze, Mina A, PA-C  ondansetron (ZOFRAN ODT) 8 MG disintegrating tablet Take 1 tablet (8 mg total) by mouth every 8 (eight) hours as needed for nausea  or vomiting. 03/17/17   Cathren Laine, MD    Allergies    Amoxicillin  Review of Systems   Review of Systems  Constitutional:  Negative for chills and fever.  Gastrointestinal:  Positive for blood in stool and constipation. Negative for abdominal pain, diarrhea, nausea, rectal pain and vomiting.  Genitourinary:  Negative for dysuria and hematuria.  Skin:  Negative for rash.  Neurological:  Negative for dizziness and light-headedness.  All other systems reviewed and are negative.  Physical Exam Updated Vital Signs BP 108/74 (BP Location: Left Arm)    Pulse 72    Temp 97.8 F (36.6 C) (Oral)    Resp 18    SpO2 100%   Physical Exam Vitals and nursing note reviewed. Exam conducted with a chaperone present.  Constitutional:      General: He is not in acute distress.    Appearance: He is not diaphoretic.  HENT:     Head: Normocephalic and atraumatic.     Mouth/Throat:     Pharynx: No oropharyngeal exudate.  Eyes:     General: No scleral icterus.    Conjunctiva/sclera: Conjunctivae normal.  Cardiovascular:     Rate and Rhythm: Normal rate and regular rhythm.     Pulses: Normal pulses.     Heart sounds: Normal heart sounds.  Pulmonary:     Effort: Pulmonary effort is normal. No respiratory distress.     Breath sounds: Normal breath sounds. No  wheezing.  Abdominal:     General: Bowel sounds are normal.     Palpations: Abdomen is soft. There is no mass.     Tenderness: There is no abdominal tenderness. There is no guarding or rebound.  Genitourinary:    Rectum: Normal. Guaiac result positive. No tenderness, anal fissure, external hemorrhoid or internal hemorrhoid. Normal anal tone.     Comments: Tech chaperone present for exam.  No rectal tenderness. No anal fissure, external hemorrhoids, gross blood to the rectal vault. Musculoskeletal:        General: Normal range of motion.     Cervical back: Normal range of motion and neck supple.     Comments: Strength and sensation intact  to bilateral upper and lower extremities.  Skin:    General: Skin is warm and dry.  Neurological:     Mental Status: He is alert.  Psychiatric:        Behavior: Behavior normal.    ED Results / Procedures / Treatments   Labs (all labs ordered are listed, but only abnormal results are displayed) Labs Reviewed  CBC WITH DIFFERENTIAL/PLATELET - Abnormal; Notable for the following components:      Result Value   WBC 3.2 (*)    Neutro Abs 1.1 (*)    All other components within normal limits  POC OCCULT BLOOD, ED - Abnormal; Notable for the following components:   Fecal Occult Bld POSITIVE (*)    All other components within normal limits  BASIC METABOLIC PANEL  HEPATIC FUNCTION PANEL  PROTIME-INR  APTT  TYPE AND SCREEN  ABO/RH    EKG None  Radiology No results found.  Procedures Procedures   Medications Ordered in ED Medications - No data to display  ED Course  I have reviewed the triage vital signs and the nursing notes.  Pertinent labs & imaging results that were available during my care of the patient were reviewed by me and considered in my medical decision making (see chart for details).  Clinical Course as of 07/09/21 1458  Sun Jul 09, 2021  1354 Patient reevaluated prior to discharge.  Patient hemodynamically stable. [SB]    Clinical Course User Index [SB] Torre Pikus A, PA-C   MDM Rules/Calculators/A&P                         Patient presents to the ED with blood in stool onset today.  Denies anticoagulant use.  No prior colonoscopy.  Vital signs stable, no hypoxia, no tachycardia. On exam, patient without external hemorrhoid, no anal fissure, no gross blood noted to rectal vault, no obvious source of bleeding. No abdominal TTP. Differential diagnosis includes anal fissure, hemorrhoids, constipation. Hemoccult positive in the ED. CBC, BMP, hepatic function panel unremarkable.   Less likely due to anal fissures or hemorrhoids. This is likely rectal bleeding  in the setting of constipation and straining. Supportive care measures and strict return precautions discussed with patient at bedside.  Patient hemodynamically stable. Patient knowledges and verbalized understanding.  Patient appears safe for discharge at this time.  Follow-up as indicated in discharge paperwork.  Final Clinical Impression(s) / ED Diagnoses Final diagnoses:  Rectal bleeding    Rx / DC Orders ED Discharge Orders     None        Runette Scifres A, PA-C 07/09/21 1458    Lacretia Leigh, MD 07/10/21 [phone]

## 2021-07-29 ENCOUNTER — Emergency Department (HOSPITAL_COMMUNITY)
Admission: EM | Admit: 2021-07-29 | Discharge: 2021-07-29 | Disposition: A | Discharge status: Home / Self Care | Payer: Self-pay | Attending: Emergency Medicine | Admitting: Emergency Medicine

## 2021-07-29 ENCOUNTER — Encounter (HOSPITAL_COMMUNITY): Payer: Self-pay | Admitting: Emergency Medicine

## 2021-07-29 ENCOUNTER — Other Ambulatory Visit: Payer: Self-pay

## 2021-07-29 DIAGNOSIS — R369 Urethral discharge, unspecified: Secondary | ICD-10-CM | POA: Insufficient documentation

## 2021-07-29 DIAGNOSIS — Z202 Contact with and (suspected) exposure to infections with a predominantly sexual mode of transmission: Secondary | ICD-10-CM | POA: Insufficient documentation

## 2021-07-29 LAB — URINALYSIS, ROUTINE W REFLEX MICROSCOPIC
Bilirubin Urine: NEGATIVE
Glucose, UA: NEGATIVE mg/dL
Hgb urine dipstick: NEGATIVE
Ketones, ur: NEGATIVE mg/dL
Leukocytes,Ua: NEGATIVE
Nitrite: NEGATIVE
Protein, ur: NEGATIVE mg/dL
Specific Gravity, Urine: 1.014 (ref 1.005–1.030)
pH: 7 (ref 5.0–8.0)

## 2021-07-29 LAB — HIV ANTIBODY (ROUTINE TESTING W REFLEX): HIV Screen 4th Generation wRfx: NONREACTIVE

## 2021-07-29 MED ORDER — DOXYCYCLINE HYCLATE 100 MG PO TABS: 100.0000 mg | ORAL_TABLET | Freq: Two times a day (BID) | ORAL | 0 refills | Status: AC

## 2021-07-29 MED ORDER — LIDOCAINE HCL (PF) 1 % IJ SOLN
1.0000 mL | Freq: Once | INTRAMUSCULAR | Status: AC
  Administered 2021-07-29: 1 mL
  Filled 2021-07-29: qty 30

## 2021-07-29 MED ORDER — CEFTRIAXONE SODIUM 1 G IJ SOLR
500.0000 mg | Freq: Once | INTRAMUSCULAR | Status: AC
  Administered 2021-07-29: 500 mg via INTRAMUSCULAR
  Filled 2021-07-29: qty 10

## 2021-07-29 NOTE — ED Triage Notes (Signed)
Patient felt irritation in penis and would like to be checked.

## 2021-07-29 NOTE — Discharge Instructions (Addendum)
Today you have been tested for gonorrhea and chlamydia.  The test to determine if you have these will take a few days. They will only call you if your tests come back positive, no news is good news. In the result that your tests are positive you have already been treated.  As part of your treatment please take the antibiotic, Doxycycline, every 12 hours until gone. A side effect of this medication includes hypersensitivity to the suns rays - please take measures to protect your skin from the sun while taking this medication.  You also have tests pending for HIV and syphilis.  You should receive a call if these results are positive.  On your discharge information there is also instructions on how to sign up for my chart.  Please complete this so that you can follow the results on your own also.  If positive for HIV or syphilis please got to [facility], your primary care doctor, or urgent care to start treatment.  Please do not have any sexual activity for the next two weeks.  After that please make sure that you always use a condom every time you have sex.  If you have any new or concerning symptoms please seek additional medical care and evaluation.

## 2021-07-29 NOTE — ED Provider Notes (Signed)
[facility] DEPT Provider Note   CSN: [id number] Arrival date & time: 07/29/21  1220     History  No chief complaint on file.   Victor [name] is a 31 y.o. male with no pertinent past medical history.  Presents to the emergency department with a chief complaint of penile discharge.  Patient states that yesterday he had an episode of penile discharge a few hours after having intercourse.  Patient states that he did not ejaculate during sexual intercourse.  Noticed a white discharge.  Patient started having sex with a new sexual partner and is concerned for possible STD.  Patient denies any dysuria, hematuria, urinary urgency, swelling or tenderness to genitals, genital sores or lesions, abdominal pain, nausea, vomiting, fever, chills.  Patient is sexually active in a mutually monogamous relationship with a male partner.  Patient does not use condoms with sexual intercourse.    HPI     Home Medications Prior to Admission medications   Medication Sig Start Date End Date Taking? Authorizing Provider  hydroxypropyl methylcellulose / hypromellose (ISOPTO TEARS / GONIOVISC) 2.5 % ophthalmic solution Place 1 drop into both eyes as needed for dry eyes.    [provider]  hydrOXYzine (ATARAX/VISTARIL) 25 MG tablet Take 1 tablet (25 mg total) by mouth every 6 (six) hours. 06/10/18   Fawze, Mina A, PA-C  ibuprofen (ADVIL,MOTRIN) 600 MG tablet Take 1 tablet (600 mg total) by mouth every 6 (six) hours as needed. Patient not taking: Reported on 10/08/2014 01/06/14   Sherwood Gambler, MD  Melatonin 3 MG CAPS Take 1 capsule (3 mg total) by mouth at bedtime as needed (sleep). 06/10/18   Fawze, Mina A, PA-C  ondansetron (ZOFRAN ODT) 8 MG disintegrating tablet Take 1 tablet (8 mg total) by mouth every 8 (eight) hours as needed for nausea or vomiting. 03/17/17   Lajean Saver, MD      Allergies    Amoxicillin    Review of Systems   Review of Systems   Constitutional:  Negative for chills and fever.  Eyes:  Negative for visual disturbance.  Respiratory:  Negative for shortness of breath.   Cardiovascular:  Negative for chest pain.  Gastrointestinal:  Negative for abdominal pain, nausea and vomiting.  Genitourinary:  Positive for penile discharge. Negative for difficulty urinating, dysuria, flank pain, frequency, genital sores, hematuria, penile pain, penile swelling, scrotal swelling, testicular pain and urgency.  Musculoskeletal:  Negative for back pain and neck pain.  Skin:  Negative for color change and rash.  Neurological:  Negative for dizziness, syncope, light-headedness and headaches.  Psychiatric/Behavioral:  Negative for confusion.    Physical Exam Updated Vital Signs BP 125/84 (BP Location: Left Arm)    Pulse 84    Temp 98.8 F (37.1 C)    Resp 16    SpO2 100%   Physical Exam Vitals and nursing note reviewed. Exam conducted with a chaperone present (Male nurse tech present as chaperone).  Constitutional:      General: He is not in acute distress.    Appearance: He is not ill-appearing, toxic-appearing or diaphoretic.  HENT:     Head: Normocephalic.  Eyes:     General: No scleral icterus.       Right eye: No discharge.        Left eye: No discharge.  Cardiovascular:     Rate and Rhythm: Normal rate.  Pulmonary:     Effort: Pulmonary effort is normal.  Abdominal:     General:  Abdomen is flat. There is no distension. There are no signs of injury.     Palpations: Abdomen is soft. There is no mass or pulsatile mass.     Tenderness: There is no abdominal tenderness. There is no guarding or rebound.     Hernia: There is no hernia in the umbilical area or ventral area.  Genitourinary:    Pubic Area: No rash or pubic lice.      Penis: Uncircumcised. No hypospadias, erythema, tenderness, discharge, swelling or lesions.      Testes: Normal. Cremasteric reflex is present.     Epididymis:     Right: Normal.     Left:  Normal.     Tanner stage (genital): 5.  Skin:    General: Skin is warm and dry.  Neurological:     General: No focal deficit present.     Mental Status: He is alert.  Psychiatric:        Behavior: Behavior is cooperative.    ED Results / Procedures / Treatments   Labs (all labs ordered are listed, but only abnormal results are displayed) Labs Reviewed  URINALYSIS, ROUTINE W REFLEX MICROSCOPIC  RPR  HIV ANTIBODY (ROUTINE TESTING W REFLEX)  GC/CHLAMYDIA PROBE AMP ([place]) NOT AT [facility]    EKG None  Radiology No results found.  Procedures Procedures    Medications Ordered in ED Medications  cefTRIAXone (ROCEPHIN) injection 500 mg (has no administration in time range)  lidocaine (PF) (XYLOCAINE) 1 % injection 1 mL (has no administration in time range)    ED Course/ Medical Decision Making/ A&P                           Medical Decision Making  Alert 31 year old male no distress, nontoxic-appearing.  Presents to the emergency department with a chief complaint of penile discharge and concern for STD.  Patient had 1 episode of penile discharge yesterday.  Patient is sexually active any mutually monogamous relationship with a male partner.  Did not use condoms when having sexual intercourse.  Patient states that this is a new sexual partner.  Will obtain urinalysis, gonorrhea/chlamydia testing, HIV, and syphilis testing.  Results were independently reviewed myself and show:   -Urinalysis is unremarkable  Suggesting making with patient about empiric treatment for gonorrhea chlamydia.  Patient is agreeable to start empiric treatment today.  We will give patient ceftriaxone and send him home on 7-day course of doxycycline.  Patient does have documented allergy to amoxicillin.  Patient states that he has received ceftriaxone in the past without any allergic reactions.  We will plan to discharge patient at this time.  Patient will follow-up with health department for positive  syphilis or HIV test results.  Discussed results, findings, treatment and follow up. Patient advised of return precautions. Patient verbalized understanding and agreed with plan.           Final Clinical Impression(s) / ED Diagnoses Final diagnoses:  Possible exposure to STD    Rx / DC Orders ED Discharge Orders          Ordered    doxycycline (VIBRA-TABS) 100 MG tablet  2 times daily        07/29/21 1250              Loni Beckwith, [place] 07/29/21 1402    Lucrezia Starch, MD 07/30/21 [phone]

## 2021-07-30 LAB — RPR: RPR Ser Ql: NONREACTIVE

## 2021-12-08 ENCOUNTER — Other Ambulatory Visit: Payer: Self-pay

## 2021-12-08 ENCOUNTER — Encounter (HOSPITAL_COMMUNITY): Payer: Self-pay

## 2021-12-08 ENCOUNTER — Emergency Department (HOSPITAL_COMMUNITY)
Admission: EM | Admit: 2021-12-08 | Discharge: 2021-12-08 | Disposition: A | Discharge status: Home / Self Care | Payer: Self-pay | Attending: Emergency Medicine | Admitting: Emergency Medicine

## 2021-12-08 DIAGNOSIS — K644 Residual hemorrhoidal skin tags: Secondary | ICD-10-CM | POA: Insufficient documentation

## 2021-12-08 DIAGNOSIS — K921 Melena: Secondary | ICD-10-CM | POA: Insufficient documentation

## 2021-12-08 DIAGNOSIS — K649 Unspecified hemorrhoids: Secondary | ICD-10-CM

## 2021-12-08 LAB — TYPE AND SCREEN
ABO/RH(D): O POS
Antibody Screen: NEGATIVE

## 2021-12-08 LAB — COMPREHENSIVE METABOLIC PANEL
ALT: 24 U/L (ref 0–44)
AST: 27 U/L (ref 15–41)
Albumin: 4 g/dL (ref 3.5–5.0)
Alkaline Phosphatase: 64 U/L (ref 38–126)
Anion gap: 3 — ABNORMAL LOW (ref 5–15)
BUN: 11 mg/dL (ref 6–20)
CO2: 29 mmol/L (ref 22–32)
Calcium: 8.7 mg/dL — ABNORMAL LOW (ref 8.9–10.3)
Chloride: 108 mmol/L (ref 98–111)
Creatinine, Ser: 1.09 mg/dL (ref 0.61–1.24)
GFR, Estimated: >60 mL/min (ref >60)
Glucose, Bld: 85 mg/dL (ref 70–99)
Potassium: 3.7 mmol/L (ref 3.5–5.1)
Sodium: 140 mmol/L (ref 135–145)
Total Bilirubin: 0.4 mg/dL (ref 0.3–1.2)
Total Protein: 6.7 g/dL (ref 6.5–8.1)

## 2021-12-08 LAB — CBC
HCT: 46.4 % (ref 39.0–52.0)
Hemoglobin: 15.2 g/dL (ref 13.0–17.0)
MCH: 28.6 pg (ref 26.0–34.0)
MCHC: 32.8 g/dL (ref 30.0–36.0)
MCV: 87.2 fL (ref 80.0–100.0)
Platelets: 213 10*3/uL (ref 150–400)
RBC: 5.32 MIL/uL (ref 4.22–5.81)
RDW: 14.7 % (ref 11.5–15.5)
WBC: 5.8 10*3/uL (ref 4.0–10.5)
nRBC: 0 % (ref 0.0–0.2)

## 2021-12-08 MED ORDER — HYDROCORT-PRAMOXINE (PERIANAL) 1-1 % EX FOAM: 1.0000 | Freq: Two times a day (BID) | CUTANEOUS | 0 refills | Status: AC

## 2021-12-08 NOTE — ED Provider Notes (Signed)
[facility]-EMERGENCY DEPT Provider Note   CSN: [id number] Arrival date & time: 12/08/21  1223     History  Chief Complaint  Patient presents with   Abdominal Pain   Blood In Stools    Victor [name] is a 31 y.o. male present emergency department with cramping abdominal pain, constipation, blood in the stool.  Patient reports that he has had some bloating this week, only 2 bowel movements, thinks that he drank too much alcohol and did not stay hydrated.  He had a hard bowel movement today and noted some blood when he wiped afterwards.  He reports he has some cramping in his abdomen but this resolved after his bowel movement.  He has been seen in the ED in the past for bright red blood in his stool, felt to be secondary to possible hemorrhoidal issues, and discharge.  He denies personal or family history of irritable bowel disease or colon cancer.  He denies any weight loss issues.  HPI     Home Medications Prior to Admission medications   Medication Sig Start Date End Date Taking? Authorizing Provider  hydrocortisone-pramoxine [facility]) rectal foam Place 1 applicator rectally 2 (two) times daily for 10 days. 12/08/21 12/18/21 Yes Skyanne Welle, Kermit Balo, MD  hydroxypropyl methylcellulose / hypromellose (ISOPTO TEARS / GONIOVISC) 2.5 % ophthalmic solution Place 1 drop into both eyes as needed for dry eyes.    [provider]  hydrOXYzine (ATARAX/VISTARIL) 25 MG tablet Take 1 tablet (25 mg total) by mouth every 6 (six) hours. 06/10/18   Fawze, Mina A, PA-C  ibuprofen (ADVIL,MOTRIN) 600 MG tablet Take 1 tablet (600 mg total) by mouth every 6 (six) hours as needed. Patient not taking: Reported on 10/08/2014 01/06/14   Pricilla Loveless, MD  Melatonin 3 MG CAPS Take 1 capsule (3 mg total) by mouth at bedtime as needed (sleep). 06/10/18   Fawze, Mina A, PA-C  ondansetron (ZOFRAN ODT) 8 MG disintegrating tablet Take 1 tablet (8 mg total) by mouth every 8 (eight)  hours as needed for nausea or vomiting. 03/17/17   Cathren Laine, MD      Allergies    Amoxicillin and Penicillins    Review of Systems   Review of Systems  Physical Exam Updated Vital Signs BP 122/81   Pulse 60   Temp 98.7 F (37.1 C) (Oral)   Resp 11   Ht 6' (1.829 m)   Wt 75.8 kg   SpO2 100%   BMI 22.65 kg/m  Physical Exam Constitutional:      General: He is not in acute distress. HENT:     Head: Normocephalic and atraumatic.  Eyes:     Conjunctiva/sclera: Conjunctivae normal.     Pupils: Pupils are equal, round, and reactive to light.  Cardiovascular:     Rate and Rhythm: Normal rate and regular rhythm.  Pulmonary:     Effort: Pulmonary effort is normal. No respiratory distress.  Abdominal:     General: There is no distension.     Tenderness: There is no abdominal tenderness. There is no guarding. Negative signs include Murphy's sign and McBurney's sign.  Genitourinary:    Comments: Rectal exam performed with chaperone present, patient does have a soft external hemorrhoid which appears mildly inflamed, no active bleeding Skin:    General: Skin is warm and dry.  Neurological:     General: No focal deficit present.     Mental Status: He is alert. Mental status is at baseline.  Psychiatric:  Mood and Affect: Mood normal.        Behavior: Behavior normal.    ED Results / Procedures / Treatments   Labs (all labs ordered are listed, but only abnormal results are displayed) Labs Reviewed  COMPREHENSIVE METABOLIC PANEL - Abnormal; Notable for the following components:      Result Value   Calcium 8.7 (*)    Anion gap 3 (*)    All other components within normal limits  CBC  POC OCCULT BLOOD, ED  TYPE AND SCREEN    EKG None  Radiology No results found.  Procedures Procedures    Medications Ordered in ED Medications - No data to display  ED Course/ Medical Decision Making/ A&P                           Medical Decision Making Amount and/or  Complexity of Data Reviewed Labs: ordered.  Risk Prescription drug management.   Patient is here with bright red blood in his stool after hard bowel movement due to constipation.  Per his medical record appears that he does have issues with intermittent constipation.  I discussed with him importance of staying hydrated, adding a fiber supplement into his diet and stool softeners as needed, and explained that constipation can lead to both diverticular bleeding and hemorrhoidal bleeding.  I suspect either of these are likely the cause of his bleeding in stool.  I doubt upper GI bleed or diverticulitis.  He has no abdominal tenderness on exam.  He is well-appearing otherwise.  Okay for discharge  I personally viewed and interpreted his labs, which were ordered per triage protocol.  He has no evidence of acute anemia, or AKI.  I do not see an indication for emergent CT imaging of the abdomen at this time.  We would like to reduce both cost and radiation for this visit today.        Final Clinical Impression(s) / ED Diagnoses Final diagnoses:  Hemorrhoids, unspecified hemorrhoid type  Blood in stool    Rx / DC Orders ED Discharge Orders          Ordered    hydrocortisone-pramoxine (PROCTOFOAM-HC) rectal foam  2 times daily       Note to Pharmacy: If patient cannot afford medication please direct him to over the counter hemorrhoidal medications   12/08/21 1345              Terald Sleeper, MD 12/08/21 1358

## 2021-12-08 NOTE — ED Triage Notes (Signed)
Patient reports generalized abdominal pain, bloating and states constipation until a few days ago, Patient states the abdominal pain is less today, but had a stool today with bright red blood on the paper, in the stool and in the water today.

## 2021-12-08 NOTE — Discharge Instructions (Addendum)
Most of these bleeding issues and your hemorrhoid problems are related to constipation.  Hard stool can cause both these issues to get worse.  I recommend you drink plenty of water at home, consider buying a fiber supplement like Metamucil, or using stool softeners which she can buy over-the-counter.

## 2022-04-09 ENCOUNTER — Encounter (HOSPITAL_COMMUNITY): Payer: Self-pay

## 2022-04-09 ENCOUNTER — Other Ambulatory Visit: Payer: Self-pay

## 2022-04-09 ENCOUNTER — Emergency Department (HOSPITAL_COMMUNITY)
Admission: EM | Admit: 2022-04-09 | Discharge: 2022-04-09 | Disposition: A | Discharge status: Home / Self Care | Payer: Self-pay | Attending: Emergency Medicine | Admitting: Emergency Medicine

## 2022-04-09 ENCOUNTER — Emergency Department (HOSPITAL_COMMUNITY): Payer: Self-pay

## 2022-04-09 DIAGNOSIS — B349 Viral infection, unspecified: Secondary | ICD-10-CM | POA: Insufficient documentation

## 2022-04-09 DIAGNOSIS — Z20822 Contact with and (suspected) exposure to covid-19: Secondary | ICD-10-CM | POA: Insufficient documentation

## 2022-04-09 LAB — COMPREHENSIVE METABOLIC PANEL
ALT: 13 U/L (ref 0–44)
AST: 20 U/L (ref 15–41)
Albumin: 4.3 g/dL (ref 3.5–5.0)
Alkaline Phosphatase: 74 U/L (ref 38–126)
Anion gap: 5 (ref 5–15)
BUN: 9 mg/dL (ref 6–20)
CO2: 26 mmol/L (ref 22–32)
Calcium: 9.4 mg/dL (ref 8.9–10.3)
Chloride: 110 mmol/L (ref 98–111)
Creatinine, Ser: 0.94 mg/dL (ref 0.61–1.24)
GFR, Estimated: >60 mL/min (ref >60)
Glucose, Bld: 92 mg/dL (ref 70–99)
Potassium: 4.1 mmol/L (ref 3.5–5.1)
Sodium: 141 mmol/L (ref 135–145)
Total Bilirubin: 0.6 mg/dL (ref 0.3–1.2)
Total Protein: 7.5 g/dL (ref 6.5–8.1)

## 2022-04-09 LAB — CBC WITH DIFFERENTIAL/PLATELET
Abs Immature Granulocytes: 0.01 10*3/uL (ref 0.00–0.07)
Basophils Absolute: 0.1 10*3/uL (ref 0.0–0.1)
Basophils Relative: 2 %
Eosinophils Absolute: 0.1 10*3/uL (ref 0.0–0.5)
Eosinophils Relative: 2 %
HCT: 47.1 % (ref 39.0–52.0)
Hemoglobin: 15.2 g/dL (ref 13.0–17.0)
Immature Granulocytes: 0 %
Lymphocytes Relative: 49 %
Lymphs Abs: 2.2 10*3/uL (ref 0.7–4.0)
MCH: 28.4 pg (ref 26.0–34.0)
MCHC: 32.3 g/dL (ref 30.0–36.0)
MCV: 87.9 fL (ref 80.0–100.0)
Monocytes Absolute: 0.4 10*3/uL (ref 0.1–1.0)
Monocytes Relative: 9 %
Neutro Abs: 1.7 10*3/uL (ref 1.7–7.7)
Neutrophils Relative %: 38 %
Platelets: 228 10*3/uL (ref 150–400)
RBC: 5.36 MIL/uL (ref 4.22–5.81)
RDW: 13.6 % (ref 11.5–15.5)
WBC: 4.5 10*3/uL (ref 4.0–10.5)
nRBC: 0 % (ref 0.0–0.2)

## 2022-04-09 LAB — RESP PANEL BY RT-PCR (FLU A&B, COVID) ARPGX2
Influenza A by PCR: NEGATIVE
Influenza B by PCR: NEGATIVE
SARS Coronavirus 2 by RT PCR: NEGATIVE

## 2022-04-09 LAB — LIPASE, BLOOD: Lipase: 38 U/L (ref 11–51)

## 2022-04-09 MED ORDER — BENZONATATE 200 MG PO CAPS: 200.0000 mg | ORAL_CAPSULE | Freq: Three times a day (TID) | ORAL | 0 refills | Status: AC

## 2022-04-09 MED ORDER — ONDANSETRON 4 MG PO TBDP: 4.0000 mg | ORAL_TABLET | Freq: Three times a day (TID) | ORAL | 0 refills | Status: DC | PRN

## 2022-04-09 MED ORDER — BENZONATATE 200 MG PO CAPS: 200.0000 mg | ORAL_CAPSULE | Freq: Three times a day (TID) | ORAL | 0 refills | Status: DC

## 2022-04-09 NOTE — ED Triage Notes (Signed)
Patient states that he was exposed to Covid at work and his significant other is covid positive. Patient c/o weakness, N/V, body aches and diarrhea

## 2022-04-09 NOTE — ED Provider Triage Note (Signed)
Emergency Medicine Provider Triage Evaluation Note  Victor [name] , a 31 y.o. male  was evaluated in triage.  Pt complains of nausea, vomiting, diarrhea, cough, and cold symptoms for the past 3 days. Patient reports his girlfriend recently tested positive for COVID. Patient reports that he is not able to keep down any food.   Review of Systems  Positive:  Negative:   Physical Exam  BP 131/84 (BP Location: Right Arm)   Pulse (!) 59   Temp 98.7 F (37.1 C) (Oral)   Resp 18   Ht 6' (1.829 m)   Wt 74.8 kg   SpO2 100%   BMI 22.38 kg/m  Gen:   Awake, no distress   Resp:  Normal effort  MSK:   Moves extremities without difficulty  Other:    Medical Decision Making  Medically screening exam initiated at 11:24 AM.  Appropriate orders placed.  Victor [name] was informed that the remainder of the evaluation will be completed by another provider, this initial triage assessment does not replace that evaluation, and the importance of remaining in the ED until their evaluation is complete.  Will order labs and CXR   Victor [name], [place] 04/09/22 1125

## 2022-04-09 NOTE — Discharge Instructions (Signed)
Tessalon as needed as prescribed for cough.  Zofran as needed as prescribed for nausea and vomiting.  Recheck with your primary care provider if symptoms persist.  Return to ER for worsening or concerning symptoms.  Recommend repeat home test in 48 hours.  Return to work per workplace protocol after repeat test.

## 2022-04-09 NOTE — ED Provider Notes (Signed)
[facility] DEPT Provider Note   CSN: [id number] Arrival date & time: 04/09/22  0930     History  Chief Complaint  Patient presents with   Covid Exposure   Weakness    Victor [name] is a 30 y.o. male.  31 year old male presents with concern for COVID-like illness after COVID exposure.  Reports his symptoms started yesterday with cough, vomiting, diarrhea and fatigue.  Patient notes that his girlfriend has also been exposed and has tested positive.  Otherwise healthy.       Home Medications Prior to Admission medications   Medication Sig Start Date End Date Taking? Authorizing Provider  benzonatate (TESSALON) 200 MG capsule Take 1 capsule (200 mg total) by mouth every 8 (eight) hours for 10 days. 04/09/22 04/19/22  Tacy Learn, PA-C  hydroxypropyl methylcellulose / hypromellose (ISOPTO TEARS / GONIOVISC) 2.5 % ophthalmic solution Place 1 drop into both eyes as needed for dry eyes.    [provider]  hydrOXYzine (ATARAX/VISTARIL) 25 MG tablet Take 1 tablet (25 mg total) by mouth every 6 (six) hours. 06/10/18   Fawze, Mina A, PA-C  ibuprofen (ADVIL,MOTRIN) 600 MG tablet Take 1 tablet (600 mg total) by mouth every 6 (six) hours as needed. Patient not taking: Reported on 10/08/2014 01/06/14   Sherwood Gambler, MD  Melatonin 3 MG CAPS Take 1 capsule (3 mg total) by mouth at bedtime as needed (sleep). 06/10/18   Nils Flack, Mina A, PA-C  ondansetron (ZOFRAN-ODT) 4 MG disintegrating tablet Take 1 tablet (4 mg total) by mouth every 8 (eight) hours as needed for nausea or vomiting. 04/09/22   Tacy Learn, PA-C      Allergies    Amoxicillin and Penicillins    Review of Systems   Review of Systems Negative except as per HPI Physical Exam Updated Vital Signs BP 131/84 (BP Location: Right Arm)   Pulse (!) 59   Temp 98.7 F (37.1 C) (Oral)   Resp 18   Ht 6' (1.829 m)   Wt 74.8 kg   SpO2 100%   BMI 22.38 kg/m  Physical Exam Vitals and  nursing note reviewed.  Constitutional:      General: He is not in acute distress.    Appearance: He is well-developed. He is not diaphoretic.  HENT:     Head: Normocephalic and atraumatic.     Right Ear: Tympanic membrane and ear canal normal.     Left Ear: Tympanic membrane and ear canal normal.     Nose: Nose normal.     Mouth/Throat:     Mouth: Mucous membranes are moist.     Pharynx: No oropharyngeal exudate or posterior oropharyngeal erythema.  Eyes:     Conjunctiva/sclera: Conjunctivae normal.  Cardiovascular:     Rate and Rhythm: Normal rate and regular rhythm.     Heart sounds: Normal heart sounds.  Pulmonary:     Effort: Pulmonary effort is normal.     Breath sounds: Normal breath sounds.  Musculoskeletal:     Cervical back: Neck supple.  Lymphadenopathy:     Cervical: No cervical adenopathy.  Skin:    General: Skin is warm and dry.     Findings: No erythema or rash.  Neurological:     Mental Status: He is alert and oriented to person, place, and time.  Psychiatric:        Behavior: Behavior normal.     ED Results / Procedures / Treatments   Labs (all labs ordered are  listed, but only abnormal results are displayed) Labs Reviewed  RESP PANEL BY RT-PCR (FLU A&B, COVID) [id number]  CBC WITH DIFFERENTIAL/PLATELET  COMPREHENSIVE METABOLIC PANEL  LIPASE, BLOOD    EKG None  Radiology DG Chest 2 View  Result Date: 04/09/2022 CLINICAL DATA:  31 y.o. male was evaluated in triage. Pt complains of nausea, vomiting, diarrhea, cough, and cold symptoms for the past 3 days. Patient reports his girlfriend recently tested positive for COVID. EXAM: CHEST - 2 VIEW COMPARISON:  10/08/2014 FINDINGS: Lungs are clear. Heart size and mediastinal contours are within normal limits. No effusion. Visualized bones unremarkable. IMPRESSION: No acute cardiopulmonary disease. Electronically Signed   By: Lucrezia Europe M.D.   On: 04/09/2022 11:43    Procedures Procedures    Medications  Ordered in ED Medications - No data to display  ED Course/ Medical Decision Making/ A&P                           Medical Decision Making Risk Prescription drug management.   31 year old male presents with concern for feeling poorly after COVID exposure with vomiting, diarrhea, body aches, cough.  On exam, is well-appearing.  Vitals are reassuring.  Work-up today is unremarkable including a CBC, CMP, lipase, is negative for COVID and flu.  Chest x-ray is negative for acute abnormality.  Discussed with patient viral illness versus possibly COVID as his symptoms of recently started, recommend that he repeat a home test in 48 hours.  He is prescribed Tessalon to help with his cough, Zofran to help with his nausea and vomiting.  Home to rest, follow-up with PCP, return to ER for worsening or concerning symptoms.        Final Clinical Impression(s) / ED Diagnoses Final diagnoses:  Viral illness  Close exposure to COVID-19 virus    Rx / DC Orders ED Discharge Orders          Ordered    ondansetron (ZOFRAN-ODT) 4 MG disintegrating tablet  Every 8 hours PRN,   Status:  Discontinued        04/09/22 1658    benzonatate (TESSALON) 200 MG capsule  Every 8 hours,   Status:  Discontinued        04/09/22 1658    benzonatate (TESSALON) 200 MG capsule  Every 8 hours        04/09/22 1701    ondansetron (ZOFRAN-ODT) 4 MG disintegrating tablet  Every 8 hours PRN        04/09/22 1701              Tacy Learn, PA-C 04/09/22 1727    Regan Lemming, MD 04/10/22 [phone]

## 2022-11-15 ENCOUNTER — Telehealth: Payer: Self-pay | Admitting: Physician Assistant

## 2022-11-15 DIAGNOSIS — K920 Hematemesis: Secondary | ICD-10-CM

## 2022-11-15 DIAGNOSIS — K625 Hemorrhage of anus and rectum: Secondary | ICD-10-CM

## 2022-11-15 DIAGNOSIS — R55 Syncope and collapse: Secondary | ICD-10-CM

## 2022-11-15 DIAGNOSIS — R197 Diarrhea, unspecified: Secondary | ICD-10-CM

## 2022-11-15 NOTE — Progress Notes (Signed)
Virtual Visit Consent   [name], you are scheduled for a virtual visit with a [facility] provider today. Just as with appointments in the office, your consent must be obtained to participate. Your consent will be active for this visit and any virtual visit you may have with one of our providers in the next 365 days. If you have a MyChart account, a copy of this consent can be sent to you electronically.  As this is a virtual visit, video technology does not allow for your provider to perform a traditional examination. This may limit your provider's ability to fully assess your condition. If your provider identifies any concerns that need to be evaluated in person or the need to arrange testing (such as labs, EKG, etc.), we will make arrangements to do so. Although advances in technology are sophisticated, we cannot ensure that it will always work on either your end or our end. If the connection with a video visit is poor, the visit may have to be switched to a telephone visit. With either a video or telephone visit, we are not always able to ensure that we have a secure connection.  By engaging in this virtual visit, you consent to the provision of healthcare and authorize for your insurance to be billed (if applicable) for the services provided during this visit. Depending on your insurance coverage, you may receive a charge related to this service.  I need to obtain your verbal consent now. Are you willing to proceed with your visit today? Victor [name] has provided verbal consent on 11/15/2022 for a virtual visit (video or telephone). Margaretann Loveless, PA-C  Date: 11/15/2022 2:24 PM  Virtual Visit via Video Note   I, Margaretann Loveless, connected with  Victor [name]  ([id number], 1991/07/11) on 11/15/22 at  2:15 PM EDT by a video-enabled telemedicine application and verified that I am speaking with the correct person using two identifiers.  Location: Patient: Virtual  Visit Location Patient: Home Provider: Virtual Visit Location Provider: Home Office   I discussed the limitations of evaluation and management by telemedicine and the availability of in person appointments. The patient expressed understanding and agreed to proceed.    History of Present Illness: Victor [name] is a 32 y.o. who identifies as a male who was assigned male at birth, and is being seen today for rectal bleeding.  HPI: Rectal Bleeding  The current episode started 5 to 7 days ago. The onset was sudden. The problem occurs continuously. The problem has been gradually improving. The pain is moderate. The stool is described as bloody. Associated symptoms include anorexia, abdominal pain (sharp abdominal pain), diarrhea, hematemesis (x 1), hemorrhoids, nausea, vomiting and headaches. Pertinent negatives include no fever, no rectal pain, no hematuria and no difficulty breathing. He has been Eating less than usual.   Syncopal episode possibly yesterday. Woke up but never remembers laying down or going to sleep.   Problems: There are no problems to display for this patient.   Allergies:  Allergies  Allergen Reactions   Amoxicillin Other (See Comments)    Happened as a baby Has patient had a PCN reaction causing immediate rash, facial/tongue/throat swelling, SOB or lightheadedness with hypotension: Unknown Has patient had a PCN reaction causing severe rash involving mucus membranes or skin necrosis: Unknown Has patient had a PCN reaction that required hospitalization: Unknown Has patient had a PCN reaction occurring within the last 10 years: No If all of the above answers are "  NO", then may proceed with Cephalosporin use.    Penicillins     Medications:  Current Outpatient Medications:    hydroxypropyl methylcellulose / hypromellose (ISOPTO TEARS / GONIOVISC) 2.5 % ophthalmic solution, Place 1 drop into both eyes as needed for dry eyes., Disp: , Rfl:    hydrOXYzine  (ATARAX/VISTARIL) 25 MG tablet, Take 1 tablet (25 mg total) by mouth every 6 (six) hours., Disp: 20 tablet, Rfl: 0   ibuprofen (ADVIL,MOTRIN) 600 MG tablet, Take 1 tablet (600 mg total) by mouth every 6 (six) hours as needed. (Patient not taking: Reported on 10/08/2014), Disp: 30 tablet, Rfl: 0   Melatonin 3 MG CAPS, Take 1 capsule (3 mg total) by mouth at bedtime as needed (sleep)., Disp: 20 capsule, Rfl: 0   ondansetron (ZOFRAN-ODT) 4 MG disintegrating tablet, Take 1 tablet (4 mg total) by mouth every 8 (eight) hours as needed for nausea or vomiting., Disp: 12 tablet, Rfl: 0  Observations/Objective: Patient is well-developed, well-nourished in no acute distress.  Resting comfortably at home.  Head is normocephalic, atraumatic.  No labored breathing.  Speech is clear and coherent with logical content.  Patient is alert and oriented at baseline.    Assessment and Plan: 1. Rectal bleeding  2. Diarrhea, unspecified type  3. Hematemesis with nausea  4. Pre-syncope  - Patient advised should be evaluated in person, he politely declines - Wants to wait one more day to see if still improves - Push fluids, electrolyte beverages - Bland diet - Strict ER precauations verbally discussed - Seek in person evaluation if not improving and if still having bleeding tomorrow  Follow Up Instructions: I discussed the assessment and treatment plan with the patient. The patient was provided an opportunity to ask questions and all were answered. The patient agreed with the plan and demonstrated an understanding of the instructions.  A copy of instructions were sent to the patient via MyChart unless otherwise noted below.   The patient was advised to call back or seek an in-person evaluation if the symptoms worsen or if the condition fails to improve as anticipated.  Time:  I spent 17 minutes with the patient via telehealth technology discussing the above problems/concerns.    Margaretann Loveless,  PA-C

## 2022-11-15 NOTE — Patient Instructions (Signed)
Victor [name], thank you for joining Margaretann Loveless, PA-C for today's virtual visit.  While this provider is not your primary care provider (PCP), if your PCP is located in our provider database this encounter information will be shared with them immediately following your visit.   A [facility] MyChart account gives you access to today's visit and all your visits, tests, and labs performed at [facility] " click here if you don't have a [facility] MyChart account or go to [email or web address]  Consent: (Patient) Victor [name] provided verbal consent for this virtual visit at the beginning of the encounter.  Current Medications:  Current Outpatient Medications:    hydroxypropyl methylcellulose / hypromellose (ISOPTO TEARS / GONIOVISC) 2.5 % ophthalmic solution, Place 1 drop into both eyes as needed for dry eyes., Disp: , Rfl:    hydrOXYzine (ATARAX/VISTARIL) 25 MG tablet, Take 1 tablet (25 mg total) by mouth every 6 (six) hours., Disp: 20 tablet, Rfl: 0   ibuprofen (ADVIL,MOTRIN) 600 MG tablet, Take 1 tablet (600 mg total) by mouth every 6 (six) hours as needed. (Patient not taking: Reported on 10/08/2014), Disp: 30 tablet, Rfl: 0   Melatonin 3 MG CAPS, Take 1 capsule (3 mg total) by mouth at bedtime as needed (sleep)., Disp: 20 capsule, Rfl: 0   ondansetron (ZOFRAN-ODT) 4 MG disintegrating tablet, Take 1 tablet (4 mg total) by mouth every 8 (eight) hours as needed for nausea or vomiting., Disp: 12 tablet, Rfl: 0   Medications ordered in this encounter:  No orders of the defined types were placed in this encounter.    *If you need refills on other medications prior to your next appointment, please contact your pharmacy*  Follow-Up: Call back or seek an in-person evaluation if the symptoms worsen or if the condition fails to improve as anticipated.  [facility] [phone]  Other Instructions  Hematemesis Hematemesis is when you vomit  blood. It is a sign of bleeding in the upper GI tract (gastrointestinal tract). The upper GI tract includes the mouth, throat, esophagus, stomach, and the upper part of the small intestine (duodenum). Hematemesis is usually caused by bleeding in the esophagus or stomach. You may suddenly vomit bright red blood. Or the blood may look like coffee grounds. You may also have other symptoms, such as: Stomach pain. Heartburn. Stool (feces) that looks black and tarry. Follow these instructions at home:  Take over-the-counter and prescription medicines only as told by your health care provider. Do not take NSAIDs, including aspirin and ibuprofen, unless your health care provider approves. These medicines can increase bleeding. Rest as needed. Drink enough fluids to keep your urine pale yellow. Take small sips of fluid at a time. Do not drink alcohol. Do not use any products that contain nicotine or tobacco. These products include cigarettes, chewing tobacco, and vaping devices, such as e-cigarettes. If you need help quitting, ask your health care provider. Keep all follow-up visits. This is important. Contact a health care provider if: You have more blood in your vomit. Your vomiting of blood begins again after it has stopped. You have persistent stomach pain. You have nausea, indigestion, or heartburn. Get help right away if: You faint. You feel weak or dizzy. You are urinating less than normal or not at all. You vomit up: Large amounts of blood or dark material that may look like coffee grounds. Bright red blood. You have any of the following: Persistent vomiting. A rapid heartbeat. Blood in your stool.  Chest pain. Difficulty breathing. These symptoms may be an emergency. Get help right away. Call 911. Do not wait to see if the symptoms will go away. Do not drive yourself to the hospital. Summary Hematemesis is when you vomit blood. It is a sign of bleeding in the upper GI tract  (gastrointestinal tract). Hematemesis is usually caused by bleeding in the esophagus or stomach. Do not take NSAIDs (including aspirin and ibuprofen), drink alcohol, or use tobacco products. Take over-the-counter and prescription medicines only as told by your health care provider. This information is not intended to replace advice given to you by your health care provider. Make sure you discuss any questions you have with your health care provider. Document Revised: 02/08/2021 Document Reviewed: 02/08/2021 Elsevier Patient Education  2023 Elsevier Inc.   Rectal Bleeding  Rectal bleeding is when blood passes out of the opening of the butt (anus). If you have rectal bleeding, you may see bright red blood in your underwear or in the toilet after you poop. You may also have blood mixed with your poop (stool) or dark red or black poop. Rectal bleeding is often a sign that something is wrong. Causes of rectal bleeding include: Diverticulosis. This is when sacs or pockets form in the large intestine (colon). Hemorrhoids. These are blood vessels around the anus or inside the rectum that are larger than normal. Anal fissures. This is a tear in the anus. Proctitis and colitis. These are conditions that happen when the rectum, colon, or anus becomes inflamed. Polyps. These are growths. They may be cancer (malignant) or not cancer (benign). Infections of the intestines. Fistulas. These are abnormal openings in the rectum and anus. Rectal prolapse. This is when a part of the rectum sticks out from the anus. Follow these instructions at home: Medicines Take over-the-counter and prescription medicines only as told by your health care provider. Ask your provider about changing or stopping your regular medicines. These include any diabetes medicine or blood thinners you take. Medicines that thin the blood can make rectal bleeding worse. Managing constipation Your condition may cause constipation. To  prevent or treat constipation, or to help make your poop soft, you may need to: Drink enough fluid to keep your pee (urine) pale yellow. Take over-the-counter or prescription medicines. Eat foods that are high in fiber, such as beans, whole grains, and fresh fruits and vegetables. Ask your provider if you need a fiber supplement. Limit foods that are high in fat and processed sugars, such as fried or sweet foods.  General instructions Try not to strain when you poop. Take a warm bath. This may help to soothe pain in your rectum. Watch for any changes in your symptoms. Contact a health care provider if: You have pain or tenderness in your abdomen. You have a fever. You feel weak or nauseous. You cannot poop. You have new or more rectal bleeding. You have black or dark red poop. You vomit, and the vomit has blood or something that looks like coffee grounds in it. Get help right away if: You faint. You have severe pain in your rectum. These symptoms may be an emergency. Get help right away. Call 911. Do not wait to see if the symptoms will go away. Do not drive yourself to the hospital. This information is not intended to replace advice given to you by your health care provider. Make sure you discuss any questions you have with your health care provider. Document Revised: 02/20/2022 Document Reviewed: 02/20/2022 Elsevier Patient  Education  2023 [employer].    If you have been instructed to have an in-person evaluation today at a local Urgent Care facility, please use the link below. It will take you to a list of all of our available [facility], including address, phone number and hours of operation. Please do not delay care.  [facility]  If you or a family member do not have a primary care provider, use the link below to schedule a visit and establish care. When you choose a [facility] primary care physician or advanced practice provider, you gain a  long-term partner in health. Find a Primary Care Provider  Learn more about 's in-office and virtual care options:  - Get Care Now

## 2023-03-24 ENCOUNTER — Telehealth: Payer: Self-pay | Admitting: Nurse Practitioner

## 2023-03-24 DIAGNOSIS — A084 Viral intestinal infection, unspecified: Secondary | ICD-10-CM

## 2023-03-24 MED ORDER — ONDANSETRON 4 MG PO TBDP: 4.0000 mg | ORAL_TABLET | Freq: Three times a day (TID) | ORAL | 0 refills | Status: DC | PRN

## 2023-03-24 NOTE — Patient Instructions (Signed)
  Victor [name], thank you for joining Claiborne Rigg, NP for today's virtual visit.  While this provider is not your primary care provider (PCP), if your PCP is located in our provider database this encounter information will be shared with them immediately following your visit.   A [facility] MyChart account gives you access to today's visit and all your visits, tests, and labs performed at [facility] " click here if you don't have a [place] [facility] MyChart account or go to [email or web address]  Consent: (Patient) Victor [name] provided verbal consent for this virtual visit at the beginning of the encounter.  Current Medications:  Current Outpatient Medications:    hydroxypropyl methylcellulose / hypromellose (ISOPTO TEARS / GONIOVISC) 2.5 % ophthalmic solution, Place 1 drop into both eyes as needed for dry eyes., Disp: , Rfl:    hydrOXYzine (ATARAX/VISTARIL) 25 MG tablet, Take 1 tablet (25 mg total) by mouth every 6 (six) hours., Disp: 20 tablet, Rfl: 0   ibuprofen (ADVIL,MOTRIN) 600 MG tablet, Take 1 tablet (600 mg total) by mouth every 6 (six) hours as needed. (Patient not taking: Reported on 10/08/2014), Disp: 30 tablet, Rfl: 0   Melatonin 3 MG CAPS, Take 1 capsule (3 mg total) by mouth at bedtime as needed (sleep)., Disp: 20 capsule, Rfl: 0   ondansetron (ZOFRAN-ODT) 4 MG disintegrating tablet, Take 1 tablet (4 mg total) by mouth every 8 (eight) hours as needed for nausea or vomiting., Disp: 12 tablet, Rfl: 0   Medications ordered in this encounter:  Meds ordered this encounter  Medications   ondansetron (ZOFRAN-ODT) 4 MG disintegrating tablet    Sig: Take 1 tablet (4 mg total) by mouth every 8 (eight) hours as needed for nausea or vomiting.    Dispense:  12 tablet    Refill:  0    Order Specific Question:   Supervising Provider    Answer:   Merrilee Jansky [id number]     *If you need refills on other medications prior to your next appointment, please  contact your pharmacy*  Follow-Up: Call back or seek an in-person evaluation if the symptoms worsen or if the condition fails to improve as anticipated.  [facility] [phone]  Other Instructions Parke Simmers diet  Clearance Coots of clears    If you have been instructed to have an in-person evaluation today at a local Urgent Care facility, please use the link below. It will take you to a list of all of our available [facility], including address, phone number and hours of operation. Please do not delay care.  [facility]  If you or a family member do not have a primary care provider, use the link below to schedule a visit and establish care. When you choose a [facility] primary care physician or advanced practice provider, you gain a long-term partner in health. Find a Primary Care Provider  Learn more about [facility]'s in-office and virtual care options: [facility] - Get Care Now

## 2023-03-24 NOTE — Progress Notes (Signed)
Virtual Visit Consent   Victor [name], you are scheduled for a virtual visit with a [facility] provider today. Just as with appointments in the office, your consent must be obtained to participate. Your consent will be active for this visit and any virtual visit you may have with one of our providers in the next 365 days. If you have a MyChart account, a copy of this consent can be sent to you electronically.  As this is a virtual visit, video technology does not allow for your provider to perform a traditional examination. This may limit your provider's ability to fully assess your condition. If your provider identifies any concerns that need to be evaluated in person or the need to arrange testing (such as labs, EKG, etc.), we will make arrangements to do so. Although advances in technology are sophisticated, we cannot ensure that it will always work on either your end or our end. If the connection with a video visit is poor, the visit may have to be switched to a telephone visit. With either a video or telephone visit, we are not always able to ensure that we have a secure connection.  By engaging in this virtual visit, you consent to the provision of healthcare and authorize for your insurance to be billed (if applicable) for the services provided during this visit. Depending on your insurance coverage, you may receive a charge related to this service.  I need to obtain your verbal consent now. Are you willing to proceed with your visit today? Victor [name] has provided verbal consent on 03/24/2023 for a virtual visit (video or telephone). Claiborne Rigg, NP  Date: 03/24/2023 10:27 AM  Virtual Visit via Video Note   I, Claiborne Rigg, connected with  Victor [name]  ([id number], [birth year]) on 03/24/23 at 10:00 AM EDT by a video-enabled telemedicine application and verified that I am speaking with the correct person using two identifiers.  Location: Patient: Virtual Visit  Location Patient: Home Provider: Virtual Visit Location Provider: Home Office   I discussed the limitations of evaluation and management by telemedicine and the availability of in person appointments. The patient expressed understanding and agreed to proceed.    History of Present Illness: Victor [name] is a 32 y.o. who identifies as a male who was assigned male at birth, and is being seen today for viral gastroenteritis.  Mr. Karges believes he has food poisoning. Symptoms started over the past 24 hours and include diarrhea, vomiting, fever and chills.  He is requesting a work note for today.    Problems: There are no problems to display for this patient.   Allergies:  Allergies  Allergen Reactions   Amoxicillin Other (See Comments)    Happened as a baby Has patient had a PCN reaction causing immediate rash, facial/tongue/throat swelling, SOB or lightheadedness with hypotension: Unknown Has patient had a PCN reaction causing severe rash involving mucus membranes or skin necrosis: Unknown Has patient had a PCN reaction that required hospitalization: Unknown Has patient had a PCN reaction occurring within the last 10 years: No If all of the above answers are "NO", then may proceed with Cephalosporin use.    Penicillins    Medications:  Current Outpatient Medications:    hydroxypropyl methylcellulose / hypromellose (ISOPTO TEARS / GONIOVISC) 2.5 % ophthalmic solution, Place 1 drop into both eyes as needed for dry eyes., Disp: , Rfl:    hydrOXYzine (ATARAX/VISTARIL) 25 MG tablet, Take 1 tablet (25 mg total) by  mouth every 6 (six) hours., Disp: 20 tablet, Rfl: 0   ibuprofen (ADVIL,MOTRIN) 600 MG tablet, Take 1 tablet (600 mg total) by mouth every 6 (six) hours as needed. (Patient not taking: Reported on 10/08/2014), Disp: 30 tablet, Rfl: 0   Melatonin 3 MG CAPS, Take 1 capsule (3 mg total) by mouth at bedtime as needed (sleep)., Disp: 20 capsule, Rfl: 0   ondansetron (ZOFRAN-ODT)  4 MG disintegrating tablet, Take 1 tablet (4 mg total) by mouth every 8 (eight) hours as needed for nausea or vomiting., Disp: 12 tablet, Rfl: 0  Observations/Objective: Patient is well-developed, well-nourished in no acute distress.  Resting comfortably at home.  Head is normocephalic, atraumatic.  No labored breathing.  Speech is clear and coherent with logical content.  Patient is alert and oriented at baseline.    Assessment and Plan: 1. Viral gastroenteritis - ondansetron (ZOFRAN-ODT) 4 MG disintegrating tablet; Take 1 tablet (4 mg total) by mouth every 8 (eight) hours as needed for nausea or vomiting.  Dispense: 12 tablet; Refill: 0   Follow Up Instructions: I discussed the assessment and treatment plan with the patient. The patient was provided an opportunity to ask questions and all were answered. The patient agreed with the plan and demonstrated an understanding of the instructions.  A copy of instructions were sent to the patient via MyChart unless otherwise noted below.   The patient was advised to call back or seek an in-person evaluation if the symptoms worsen or if the condition fails to improve as anticipated.  Time:  I spent 11 minutes with the patient via telehealth technology discussing the above problems/concerns.    Claiborne Rigg, NP

## 2023-04-09 ENCOUNTER — Other Ambulatory Visit: Payer: Self-pay

## 2023-04-09 ENCOUNTER — Emergency Department (HOSPITAL_COMMUNITY)
Admission: EM | Admit: 2023-04-09 | Discharge: 2023-04-09 | Disposition: A | Discharge status: Home / Self Care | Payer: Self-pay | Attending: Emergency Medicine | Admitting: Emergency Medicine

## 2023-04-09 ENCOUNTER — Encounter (HOSPITAL_COMMUNITY): Payer: Self-pay

## 2023-04-09 DIAGNOSIS — R369 Urethral discharge, unspecified: Secondary | ICD-10-CM | POA: Insufficient documentation

## 2023-04-09 DIAGNOSIS — Z202 Contact with and (suspected) exposure to infections with a predominantly sexual mode of transmission: Secondary | ICD-10-CM | POA: Insufficient documentation

## 2023-04-09 DIAGNOSIS — Z21 Asymptomatic human immunodeficiency virus [HIV] infection status: Secondary | ICD-10-CM | POA: Insufficient documentation

## 2023-04-09 LAB — URINALYSIS, ROUTINE W REFLEX MICROSCOPIC
Bacteria, UA: NONE SEEN
Bilirubin Urine: NEGATIVE
Glucose, UA: NEGATIVE mg/dL
Hgb urine dipstick: NEGATIVE
Ketones, ur: NEGATIVE mg/dL
Nitrite: NEGATIVE
Protein, ur: NEGATIVE mg/dL
Specific Gravity, Urine: 1.025 (ref 1.005–1.030)
WBC, UA: >50 WBC/hpf (ref 0–5)
pH: 6 (ref 5.0–8.0)

## 2023-04-09 LAB — HIV ANTIBODY (ROUTINE TESTING W REFLEX): HIV Screen 4th Generation wRfx: NONREACTIVE

## 2023-04-09 MED ORDER — CEFTRIAXONE SODIUM 1 G IJ SOLR
500.0000 mg | Freq: Once | INTRAMUSCULAR | Status: AC
  Administered 2023-04-09: 500 mg via INTRAMUSCULAR
  Filled 2023-04-09: qty 10

## 2023-04-09 MED ORDER — DOXYCYCLINE HYCLATE 100 MG PO TABS: 100.0000 mg | ORAL_TABLET | Freq: Two times a day (BID) | ORAL | 0 refills | Status: DC

## 2023-04-09 MED ORDER — LIDOCAINE HCL (PF) 1 % IJ SOLN: 1.0000 mL | Freq: Once | INTRAMUSCULAR | Status: DC

## 2023-04-09 MED ORDER — DOXYCYCLINE HYCLATE 100 MG PO TABS
100.0000 mg | ORAL_TABLET | Freq: Once | ORAL | Status: AC
Start: 2023-04-09 — End: 2023-04-09
  Administered 2023-04-09: 100 mg via ORAL
  Filled 2023-04-09: qty 1

## 2023-04-09 NOTE — ED Triage Notes (Signed)
Patient wants to be tested for STDs. Stated he has yellow discharge out of penis for 2 days. Scarring on his penis. Burns with urination. Unprotected sex 1 month ago.

## 2023-04-09 NOTE — ED Provider Notes (Signed)
[facility] EMERGENCY DEPARTMENT AT [facility] Provider Note   CSN: [id number] Arrival date & time: 04/09/23  1103     History  Chief Complaint  Patient presents with   Exposure to STD    Victor [name] A Gores is a 32 y.o. male with overall noncontributory past medical history who presents with concern for yellow discharge out of penis for 2 days as well as burning with urination.  He endorses unprotected sex with 1 partner around 1 month ago.  He does report previous gonorrhea/chlamydia.  He denies any ulcers on the penis or elsewhere on the body.  He reports some aching sensation in the testicles but without overt pain.  He endorses allergy to amoxicillin and penicillin.   Exposure to STD       Home Medications Prior to Admission medications   Medication Sig Start Date End Date Taking? Authorizing Provider  doxycycline (VIBRA-TABS) 100 MG tablet Take 1 tablet (100 mg total) by mouth 2 (two) times daily. 04/09/23  Yes Kathleena Freeman H, PA-C  hydroxypropyl methylcellulose / hypromellose (ISOPTO TEARS / GONIOVISC) 2.5 % ophthalmic solution Place 1 drop into both eyes as needed for dry eyes.    [provider]  hydrOXYzine (ATARAX/VISTARIL) 25 MG tablet Take 1 tablet (25 mg total) by mouth every 6 (six) hours. 06/10/18   Fawze, Mina A, PA-C  ibuprofen (ADVIL,MOTRIN) 600 MG tablet Take 1 tablet (600 mg total) by mouth every 6 (six) hours as needed. Patient not taking: Reported on 10/08/2014 01/06/14   Pricilla Loveless, MD  Melatonin 3 MG CAPS Take 1 capsule (3 mg total) by mouth at bedtime as needed (sleep). 06/10/18   Fawze, Mina A, PA-C  ondansetron (ZOFRAN-ODT) 4 MG disintegrating tablet Take 1 tablet (4 mg total) by mouth every 8 (eight) hours as needed for nausea or vomiting. 03/24/23   Claiborne Rigg, NP      Allergies    Amoxicillin and Penicillins    Review of Systems   Review of Systems  All other systems reviewed and are negative.   Physical  Exam Updated Vital Signs BP (!) 146/90   Pulse 61   Temp 98.6 F (37 C) (Oral)   Resp 19   Ht 6' (1.829 m)   Wt 70.3 kg   SpO2 100%   BMI 21.02 kg/m  Physical Exam Vitals and nursing note reviewed.  Constitutional:      General: He is not in acute distress.    Appearance: Normal appearance.  HENT:     Head: Normocephalic and atraumatic.  Eyes:     General:        Right eye: No discharge.        Left eye: No discharge.  Cardiovascular:     Rate and Rhythm: Normal rate and regular rhythm.     Heart sounds: No murmur heard.    No friction rub. No gallop.  Pulmonary:     Effort: Pulmonary effort is normal.     Breath sounds: Normal breath sounds.  Abdominal:     General: Bowel sounds are normal.     Palpations: Abdomen is soft.  Genitourinary:    Comments: Patient does have some yellowish discharge from the head of the penis.  No ulcers on the shaft of the penis, testicles.  He has some mild inguinal lymphadenopathy on the left. Skin:    General: Skin is warm and dry.     Capillary Refill: Capillary refill takes less than 2 seconds.  Neurological:     Mental Status: He is alert and oriented to person, place, and time.  Psychiatric:        Mood and Affect: Mood normal.        Behavior: Behavior normal.     ED Results / Procedures / Treatments   Labs (all labs ordered are listed, but only abnormal results are displayed) Labs Reviewed  URINALYSIS, ROUTINE W REFLEX MICROSCOPIC - Abnormal; Notable for the following components:      Result Value   Leukocytes,Ua MODERATE (*)    All other components within normal limits  RPR  HIV ANTIBODY (ROUTINE TESTING W REFLEX)  GC/CHLAMYDIA PROBE AMP ([place]) NOT AT [facility]    EKG None  Radiology No results found.  Procedures Procedures    Medications Ordered in ED Medications  cefTRIAXone (ROCEPHIN) injection 500 mg (has no administration in time range)  lidocaine (PF) (XYLOCAINE) 1 % injection 1-2.1 mL (has no  administration in time range)  doxycycline (VIBRA-TABS) tablet 100 mg (has no administration in time range)    ED Course/ Medical Decision Making/ A&P                                 Medical Decision Making Amount and/or Complexity of Data Reviewed Labs: ordered.   This patient is a 32 y.o. male who presents to the ED for concern of facial transmitted infection, penile discharge.   Differential diagnoses prior to evaluation: Gonorrhea, chlamydia, syphilis, HIV, or other sexually transmitted infection. Also considered other urethritis, UTI, versus other  Past Medical History / Social History / Additional history: Chart reviewed. Pertinent results include: Overall noncontributory  Physical Exam: Physical exam performed. The pertinent findings include: Patient does have some yellowish discharge from the head of the penis.  No ulcers on the shaft of the penis, testicles.  He has some mild inguinal lymphadenopathy on the left  Medications / Treatment: Patient given Rocephin, doxycycline in the ED, discharged with doxycycline to cover for chlamydia and gonorrhea.  Based on physical exam and history overall with lower clinical suspicion for RPR, HIV but we will test for these conditions, patient will follow-up on the results on his patient portal.  Discussed safe sex practices, informing all partners of any positive diagnoses.  UA with moderate leukocytes, greater than 50 white blood cells, given his gross pyuria, I think this is sequela from his likely STI.   Disposition: After consideration of the diagnostic results and the patients response to treatment, I feel that patient with penile discharge consistent with likely chlamydia/gonorrhea, treated as above.   emergency department workup does not suggest an emergent condition requiring admission or immediate intervention beyond what has been performed at this time. The plan is: as above. The patient is safe for discharge and has been  instructed to return immediately for worsening symptoms, change in symptoms or any other concerns.  Final Clinical Impression(s) / ED Diagnoses Final diagnoses:  Penile discharge  STD exposure    Rx / DC Orders ED Discharge Orders          Ordered    doxycycline (VIBRA-TABS) 100 MG tablet  2 times daily        04/09/23 1255              Edwar Coe, [place], PA-C 04/09/23 1301    Gerhard Munch, MD 04/09/23 1643

## 2023-04-09 NOTE — Discharge Instructions (Addendum)
You were tested today for a number of sexually transmitted diseases.  The tests that we performed were for chlamydia, gonorrhea, syphilis (RPR), and HIV.  The results of these tests will not come back for 24 to 48 hours on average.  Please follow the instructions on your discharge paperwork to set up your patient portal and check for these results.  The antibiotics that you are going to take will cover for a chlamydia or gonorrhea infection so you do not need to follow-up if either of these tests are positive.  If your RPR is positive you need to present for further treatment for syphilis, and if you have a positive HIV test I also recommend that you return for further evaluation and to discuss this diagnosis.  In the meantime I would refrain from any sexual activity for at least 10 days, or until your symptoms fully resolve.  The antibiotics that you are taking can be slightly rough on your stomach, I recommend that you take them on a full stomach and swallow the pills with a large glass of water. They can also make you sensitive to the sun, so I recommend that you wear sunscreen for the entire time that you are taking them.  Please take the entire course of antibiotics, even if your symptoms resolve sooner.  Please inform any of your sexual partners of any positive diagnoses and I recommend that you engage in safe sex practices in the future including using a condom, and asking all of your sexual partners about their current STD history.  If your symptoms do not resolve despite treatment please return to the emergency department or the health department for further evaluation.

## 2023-04-10 LAB — SYPHILIS: RPR W/REFLEX TO RPR TITER AND TREPONEMAL ANTIBODIES, TRADITIONAL SCREENING AND DIAGNOSIS ALGORITHM: RPR Ser Ql: NONREACTIVE

## 2023-04-10 LAB — GC/CHLAMYDIA PROBE AMP (~~LOC~~) NOT AT ARMC
Chlamydia: NEGATIVE
Comment: NEGATIVE
Comment: NORMAL
Neisseria Gonorrhea: POSITIVE — AB

## 2023-08-12 ENCOUNTER — Telehealth: Payer: Self-pay | Admitting: Emergency Medicine

## 2023-08-12 DIAGNOSIS — J111 Influenza due to unidentified influenza virus with other respiratory manifestations: Secondary | ICD-10-CM

## 2023-08-12 MED ORDER — OSELTAMIVIR PHOSPHATE 75 MG PO CAPS: 75.0000 mg | ORAL_CAPSULE | Freq: Two times a day (BID) | ORAL | 0 refills | Status: DC

## 2023-08-12 MED ORDER — AZELASTINE HCL 0.1 % NA SOLN: 2.0000 | Freq: Two times a day (BID) | NASAL | 0 refills | Status: AC

## 2023-08-12 MED ORDER — BENZONATATE 100 MG PO CAPS: 100.0000 mg | ORAL_CAPSULE | Freq: Two times a day (BID) | ORAL | 0 refills | Status: AC | PRN

## 2023-08-12 NOTE — Progress Notes (Signed)
Virtual Visit Consent   Victor [name], you are scheduled for a virtual visit with a [facility] provider today. Just as with appointments in the office, your consent must be obtained to participate. Your consent will be active for this visit and any virtual visit you may have with one of our providers in the next 365 days. If you have a MyChart account, a copy of this consent can be sent to you electronically.  As this is a virtual visit, video technology does not allow for your provider to perform a traditional examination. This may limit your provider's ability to fully assess your condition. If your provider identifies any concerns that need to be evaluated in person or the need to arrange testing (such as labs, EKG, etc.), we will make arrangements to do so. Although advances in technology are sophisticated, we cannot ensure that it will always work on either your end or our end. If the connection with a video visit is poor, the visit may have to be switched to a telephone visit. With either a video or telephone visit, we are not always able to ensure that we have a secure connection.  By engaging in this virtual visit, you consent to the provision of healthcare and authorize for your insurance to be billed (if applicable) for the services provided during this visit. Depending on your insurance coverage, you may receive a charge related to this service.  I need to obtain your verbal consent now. Are you willing to proceed with your visit today? Victor [name] has provided verbal consent on 08/12/2023 for a virtual visit (video or telephone). Cathlyn Parsons, NP  Date: 08/12/2023 9:09 AM  Virtual Visit via Video Note   I, Cathlyn Parsons, connected with  Victor [name]  ([id number], 1990-12-04) on 08/12/23 at  9:00 AM EST by a video-enabled telemedicine application and verified that I am speaking with the correct person using two identifiers.  Location: Patient: Virtual Visit  Location Patient: Home Provider: Virtual Visit Location Provider: Home Office   I discussed the limitations of evaluation and management by telemedicine and the availability of in person appointments. The patient expressed understanding and agreed to proceed.    History of Present Illness: Victor [name] is a 33 y.o. who identifies as a male who was assigned male at birth, and is being seen today for cough, nasal congestion, sore throat, body aches, chills/sweats, diarrhea and vomiting. All started 08/10/23. 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 Is taking nyquil and emergen-C, and mucinex for little relief of sx. Does feel a little short of breath in that he feels he is coughing a lot but not getting up all of the mucus. The nasal discharge and sputum look the same, clear with some blood in it. Does have post nasal drainage.   HPI: HPI  Problems: There are no active problems to display for this patient.   Allergies:  Allergies  Allergen Reactions   Amoxicillin Other (See Comments)    Happened as a baby Has patient had a PCN reaction causing immediate rash, facial/tongue/throat swelling, SOB or lightheadedness with hypotension: Unknown Has patient had a PCN reaction causing severe rash involving mucus membranes or skin necrosis: Unknown Has patient had a PCN reaction that required hospitalization: Unknown Has patient had a PCN reaction occurring within the last 10 years: No If all of the above answers are "NO", then may proceed with Cephalosporin use.    Penicillins    Medications:  Current Outpatient Medications:    azelastine (ASTELIN) 0.1 % nasal spray, Place 2 sprays into both nostrils 2 (two) times daily. Use in each nostril as directed, Disp: 30 mL, Rfl: 0   benzonatate (TESSALON) 100 MG capsule, Take 1 capsule (100 mg total) by mouth 2 (two) times daily as needed for cough., Disp: 20 capsule, Rfl: 0   oseltamivir (TAMIFLU) 75 MG capsule, Take 1 capsule (75 mg total) by mouth 2  (two) times daily., Disp: 10 capsule, Rfl: 0   doxycycline (VIBRA-TABS) 100 MG tablet, Take 1 tablet (100 mg total) by mouth 2 (two) times daily., Disp: 14 tablet, Rfl: 0   hydroxypropyl methylcellulose / hypromellose (ISOPTO TEARS / GONIOVISC) 2.5 % ophthalmic solution, Place 1 drop into both eyes as needed for dry eyes., Disp: , Rfl:    hydrOXYzine (ATARAX/VISTARIL) 25 MG tablet, Take 1 tablet (25 mg total) by mouth every 6 (six) hours., Disp: 20 tablet, Rfl: 0   ibuprofen (ADVIL,MOTRIN) 600 MG tablet, Take 1 tablet (600 mg total) by mouth every 6 (six) hours as needed. (Patient not taking: Reported on 10/08/2014), Disp: 30 tablet, Rfl: 0   Melatonin 3 MG CAPS, Take 1 capsule (3 mg total) by mouth at bedtime as needed (sleep)., Disp: 20 capsule, Rfl: 0   ondansetron (ZOFRAN-ODT) 4 MG disintegrating tablet, Take 1 tablet (4 mg total) by mouth every 8 (eight) hours as needed for nausea or vomiting., Disp: 12 tablet, Rfl: 0  Observations/Objective: Patient is well-developed, well-nourished in no acute distress.  Resting comfortably  at home.  Head is normocephalic, atraumatic.  No labored breathing. Occasional coughing spells Speech is clear and coherent with logical content.  Patient is alert and oriented at baseline.  Sounds very congested on video  Assessment and Plan: 1. Influenza-like illness (Primary)  I suspect influenza based on exposure and sx. Discussed need for in person eval if SOB worsens, he feels like he is getting worse, not better.   Follow Up Instructions: I discussed the assessment and treatment plan with the patient. The patient was provided an opportunity to ask questions and all were answered. The patient agreed with the plan and demonstrated an understanding of the instructions.  A copy of instructions were sent to the patient via MyChart unless otherwise noted below.   The patient was advised to call back or seek an in-person evaluation if the symptoms worsen or if the  condition fails to improve as anticipated.    Cathlyn Parsons, NP

## 2023-08-12 NOTE — Patient Instructions (Signed)
Victor [name], thank you for joining Cathlyn Parsons, NP for today's virtual visit.  While this provider is not your primary care provider (PCP), if your PCP is located in our provider database this encounter information will be shared with them immediately following your visit.   A [facility] MyChart account gives you access to today's visit and all your visits, tests, and labs performed at [facility] " click here if you don't have a [facility] MyChart account or go to [email or web address]  Consent: (Patient) Victor [name] provided verbal consent for this virtual visit at the beginning of the encounter.  Current Medications:  Current Outpatient Medications:    azelastine (ASTELIN) 0.1 % nasal spray, Place 2 sprays into both nostrils 2 (two) times daily. Use in each nostril as directed, Disp: 30 mL, Rfl: 0   benzonatate (TESSALON) 100 MG capsule, Take 1 capsule (100 mg total) by mouth 2 (two) times daily as needed for cough., Disp: 20 capsule, Rfl: 0   oseltamivir (TAMIFLU) 75 MG capsule, Take 1 capsule (75 mg total) by mouth 2 (two) times daily., Disp: 10 capsule, Rfl: 0   doxycycline (VIBRA-TABS) 100 MG tablet, Take 1 tablet (100 mg total) by mouth 2 (two) times daily., Disp: 14 tablet, Rfl: 0   hydroxypropyl methylcellulose / hypromellose (ISOPTO TEARS / GONIOVISC) 2.5 % ophthalmic solution, Place 1 drop into both eyes as needed for dry eyes., Disp: , Rfl:    hydrOXYzine (ATARAX/VISTARIL) 25 MG tablet, Take 1 tablet (25 mg total) by mouth every 6 (six) hours., Disp: 20 tablet, Rfl: 0   ibuprofen (ADVIL,MOTRIN) 600 MG tablet, Take 1 tablet (600 mg total) by mouth every 6 (six) hours as needed. (Patient not taking: Reported on 10/08/2014), Disp: 30 tablet, Rfl: 0   Melatonin 3 MG CAPS, Take 1 capsule (3 mg total) by mouth at bedtime as needed (sleep)., Disp: 20 capsule, Rfl: 0   ondansetron (ZOFRAN-ODT) 4 MG disintegrating tablet, Take 1 tablet (4 mg total) by  mouth every 8 (eight) hours as needed for nausea or vomiting., Disp: 12 tablet, Rfl: 0   Medications ordered in this encounter:  Meds ordered this encounter  Medications   oseltamivir (TAMIFLU) 75 MG capsule    Sig: Take 1 capsule (75 mg total) by mouth 2 (two) times daily.    Dispense:  10 capsule    Refill:  0   azelastine (ASTELIN) 0.1 % nasal spray    Sig: Place 2 sprays into both nostrils 2 (two) times daily. Use in each nostril as directed    Dispense:  30 mL    Refill:  0   benzonatate (TESSALON) 100 MG capsule    Sig: Take 1 capsule (100 mg total) by mouth 2 (two) times daily as needed for cough.    Dispense:  20 capsule    Refill:  0     *If you need refills on other medications prior to your next appointment, please contact your pharmacy*  Follow-Up: Call back or seek an in-person evaluation if the symptoms worsen or if the condition fails to improve as anticipated.  [facility] [phone]  Other Instructions  Try using saline nasal spray or try using saline irrigation, such as with a neti pot, several times a day while you are sick. Many neti pots come with salt packets premeasured to use to make saline. If you use your own salt, make sure it is kosher salt or sea salt (don't use table salt  as it has iodine in it and you don't need that in your nose). Use distilled water to make saline. If you mix your own saline using your own salt, the recipe is 1/4 teaspoon salt in 1 cup warm water. Using saline irrigation can help manage your nasal congestion.  Continue using Mucinex and nyquil. Check the nyquil package to make sure it doesn't have acetaminophen (tylenol) in it before you take more tylenol. You can also take ibuprofen for fever or pain instead of or in addition to tylenol.     If you have been instructed to have an in-person evaluation today at a local Urgent Care facility, please use the link below. It will take you to a list of all of our available  [facility], including address, phone number and hours of operation. Please do not delay care.  [facility]  If you or a family member do not have a primary care provider, use the link below to schedule a visit and establish care. When you choose a [facility] primary care physician or advanced practice provider, you gain a long-term partner in health. Find a Primary Care Provider  Learn more about [facility]'s in-office and virtual care options: [facility] - Get Care Now

## 2024-04-22 ENCOUNTER — Emergency Department (HOSPITAL_COMMUNITY)
Admission: EM | Admit: 2024-04-22 | Discharge: 2024-04-22 | Disposition: A | Discharge status: Home / Self Care | Payer: Self-pay | Attending: Emergency Medicine | Admitting: Emergency Medicine

## 2024-04-22 ENCOUNTER — Encounter (HOSPITAL_COMMUNITY): Payer: Self-pay

## 2024-04-22 ENCOUNTER — Other Ambulatory Visit: Payer: Self-pay

## 2024-04-22 DIAGNOSIS — Z202 Contact with and (suspected) exposure to infections with a predominantly sexual mode of transmission: Secondary | ICD-10-CM | POA: Insufficient documentation

## 2024-04-22 DIAGNOSIS — Z711 Person with feared health complaint in whom no diagnosis is made: Secondary | ICD-10-CM

## 2024-04-22 LAB — URINALYSIS, ROUTINE W REFLEX MICROSCOPIC
Bilirubin Urine: NEGATIVE
Glucose, UA: NEGATIVE mg/dL
Hgb urine dipstick: NEGATIVE
Ketones, ur: NEGATIVE mg/dL
Leukocytes,Ua: NEGATIVE
Nitrite: NEGATIVE
Protein, ur: NEGATIVE mg/dL
Specific Gravity, Urine: 1.02 (ref 1.005–1.030)
pH: 7 (ref 5.0–8.0)

## 2024-04-22 LAB — RAPID HIV SCREEN (HIV 1/2 AB+AG)
HIV 1/2 Antibodies: NONREACTIVE
HIV-1 P24 Antigen - HIV24: NONREACTIVE

## 2024-04-22 MED ORDER — LIDOCAINE HCL 1 % IJ SOLN
INTRAMUSCULAR | Status: AC
  Administered 2024-04-22: 20 mL
  Filled 2024-04-22: qty 20

## 2024-04-22 MED ORDER — DOXYCYCLINE HYCLATE 100 MG PO TABS
100.0000 mg | ORAL_TABLET | Freq: Once | ORAL | Status: AC
  Administered 2024-04-22: 100 mg via ORAL
  Filled 2024-04-22: qty 1

## 2024-04-22 MED ORDER — DOXYCYCLINE HYCLATE 100 MG PO CAPS: 100.0000 mg | ORAL_CAPSULE | Freq: Two times a day (BID) | ORAL | 0 refills | Status: DC

## 2024-04-22 MED ORDER — CEFTRIAXONE SODIUM 1 G IJ SOLR
500.0000 mg | Freq: Once | INTRAMUSCULAR | Status: AC
  Administered 2024-04-22: 500 mg via INTRAMUSCULAR
  Filled 2024-04-22: qty 10

## 2024-04-22 NOTE — Discharge Instructions (Signed)
 Today you are tested for STDs.  Please monitor the results on MyChart.  Since you have decided to proceed with empiric treatment you have already been treated for gonorrhea today.  Take doxycycline  for chlamydia treatment. Do not have sexual intercourse until you have results are back or finish full course of treatment.  Partners may need to be treated as well.  Return to ER with new or worsening symptoms.

## 2024-04-22 NOTE — ED Provider Notes (Signed)
 [place] EMERGENCY DEPARTMENT AT [facility] Provider Note   CSN: [id number] Arrival date & time: 04/22/24  1106     Patient presents with: No chief complaint on file.   Victor [name] is a 33 y.o. male with noncontributory past medical history reports with complaint of penile irritation, dysuria and clear discharge for 3 days.  He has not noted any rash.  He reports that he is sexually active but has not been sexually active in approximately 3 months.  He denies any fever, abdominal pain or flank pain.   HPI     Prior to Admission medications   Medication Sig Start Date End Date Taking? Authorizing Provider  azelastine  (ASTELIN ) 0.1 % nasal spray Place 2 sprays into both nostrils 2 (two) times daily. Use in each nostril as directed 08/12/23   Richad Jon HERO, NP  benzonatate  (TESSALON ) 100 MG capsule Take 1 capsule (100 mg total) by mouth 2 (two) times daily as needed for cough. 08/12/23   Richad Jon HERO, NP  doxycycline  (VIBRA -TABS) 100 MG tablet Take 1 tablet (100 mg total) by mouth 2 (two) times daily. 04/09/23   Prosperi, Florence H, PA-C  hydroxypropyl methylcellulose / hypromellose (ISOPTO TEARS / GONIOVISC) 2.5 % ophthalmic solution Place 1 drop into both eyes as needed for dry eyes.    [provider]  hydrOXYzine  (ATARAX /VISTARIL ) 25 MG tablet Take 1 tablet (25 mg total) by mouth every 6 (six) hours. 06/10/18   Fawze, Mina A, PA-C  ibuprofen  (ADVIL ,MOTRIN ) 600 MG tablet Take 1 tablet (600 mg total) by mouth every 6 (six) hours as needed. Patient not taking: Reported on 10/08/2014 01/06/14   Freddi Hamilton, MD  Melatonin 3 MG CAPS Take 1 capsule (3 mg total) by mouth at bedtime as needed (sleep). 06/10/18   Fawze, Mina A, PA-C  ondansetron  (ZOFRAN -ODT) 4 MG disintegrating tablet Take 1 tablet (4 mg total) by mouth every 8 (eight) hours as needed for nausea or vomiting. 03/24/23   Fleming, Zelda W, NP  oseltamivir  (TAMIFLU ) 75 MG capsule Take 1 capsule (75  mg total) by mouth 2 (two) times daily. 08/12/23   Richad Jon HERO, NP    Allergies: Amoxicillin and Penicillins    Review of Systems  Genitourinary:  Positive for dysuria.    Updated Vital Signs There were no vitals taken for this visit.  Physical Exam Vitals and nursing note reviewed.  Constitutional:      General: He is not in acute distress.    Appearance: He is not toxic-appearing.  HENT:     Head: Normocephalic and atraumatic.  Eyes:     General: No scleral icterus.    Conjunctiva/sclera: Conjunctivae normal.  Cardiovascular:     Rate and Rhythm: Normal rate and regular rhythm.     Pulses: Normal pulses.     Heart sounds: Normal heart sounds.  Pulmonary:     Effort: Pulmonary effort is normal. No respiratory distress.     Breath sounds: Normal breath sounds.  Abdominal:     General: Abdomen is flat. Bowel sounds are normal.     Palpations: Abdomen is soft.     Tenderness: There is no abdominal tenderness.  Genitourinary:    Comments: Exam with chaperone.  No penile discharge or rash. Skin:    General: Skin is warm and dry.     Findings: No lesion.  Neurological:     General: No focal deficit present.     Mental Status: He is alert and oriented  to person, place, and time. Mental status is at baseline.     (all labs ordered are listed, but only abnormal results are displayed) Labs Reviewed  URINALYSIS, ROUTINE W REFLEX MICROSCOPIC  RPR  RAPID HIV SCREEN (HIV 1/2 AB+AG)  GC/CHLAMYDIA PROBE AMP ([place]) NOT AT [facility]    EKG: None  Radiology: No results found.   Procedures   Medications Ordered in the ED  cefTRIAXone  (ROCEPHIN ) injection 500 mg (500 mg Intramuscular Given 04/22/24 1144)  doxycycline  (VIBRA -TABS) tablet 100 mg (100 mg Oral Given 04/22/24 1145)  lidocaine  (XYLOCAINE ) 1 % (with pres) injection (20 mLs  Given 04/22/24 1145)                                    Medical Decision Making Amount and/or Complexity of Data Reviewed Labs:  ordered.  Risk Prescription drug management.   This patient presents to the ED for concern of rash, this involves an extensive number of treatment options, and is a complaint that carries with it a high risk of complications and morbidity.  The differential diagnosis includes yeast infection, STD, urinary tract infection   Problem List / ED Course / Critical interventions / Medication management  Patient presents to emergency room with complaint of 1 day of penile discharge and irritation.  He is unsure if this is secondary to a new soap he has started using but he does request to be STD testing.  He reports he is not currently sexually active but it was approximately 3 months ago.  On my exam he has no rash or discharge seen on exam.  He is not having any testicular pain or swelling.  He is hemodynamically stable.  Does not express any additional complaints. Empiric treatment was discussed with patient.  Patient ultimately would like to be empirically treated for STDs.  He was given Rocephin  and first dose of doxycycline  here.  He will monitor results of STD testing on MyChart.  He is given return precautions.        Final diagnoses:  Concern about STD in male without diagnosis    ED Discharge Orders     None          Shermon Warren SAILOR, PA-C 04/22/24 1248    Emil Share, DO 04/22/24 1324

## 2024-04-22 NOTE — ED Triage Notes (Signed)
 Pt reports with clear discharge and urethral irritation x 2-3 days.

## 2024-04-23 LAB — GC/CHLAMYDIA PROBE AMP (~~LOC~~) NOT AT ARMC
Chlamydia: NEGATIVE
Comment: NEGATIVE
Comment: NORMAL
Neisseria Gonorrhea: NEGATIVE

## 2024-04-23 LAB — RPR: RPR Ser Ql: NONREACTIVE

## 2024-04-29 ENCOUNTER — Other Ambulatory Visit: Payer: Self-pay

## 2024-04-29 ENCOUNTER — Emergency Department (HOSPITAL_COMMUNITY)
Admission: EM | Admit: 2024-04-29 | Discharge: 2024-04-30 | Disposition: A | Discharge status: Home / Self Care | Payer: Self-pay | Attending: Emergency Medicine | Admitting: Emergency Medicine

## 2024-04-29 ENCOUNTER — Encounter (HOSPITAL_COMMUNITY): Payer: Self-pay

## 2024-04-29 DIAGNOSIS — B349 Viral infection, unspecified: Secondary | ICD-10-CM | POA: Insufficient documentation

## 2024-04-29 LAB — RESP PANEL BY RT-PCR (RSV, FLU A&B, COVID)  RVPGX2
Influenza A by PCR: NEGATIVE
Influenza B by PCR: NEGATIVE
Resp Syncytial Virus by PCR: NEGATIVE
SARS Coronavirus 2 by RT PCR: NEGATIVE

## 2024-04-29 LAB — GROUP A STREP BY PCR: Group A Strep by PCR: NOT DETECTED

## 2024-04-29 NOTE — ED Triage Notes (Signed)
 PT states he has been having flu like symptoms (aches, fever, chills) x3 days as well as eye redness. PT took tylenol about 19:00

## 2024-04-29 NOTE — Discharge Instructions (Signed)
 Recommend use of a preservative-free eyedrops during the day, can use preservative-free eye ointment such as Systane at night. Follow-up with primary care for recheck.  If you do not have a primary care provider, please contact [facility] and wellness.

## 2024-04-29 NOTE — ED Provider Notes (Signed)
 [place] EMERGENCY DEPARTMENT AT [facility] Provider Note   CSN: [id number] Arrival date & time: 04/29/24  2007     Patient presents with: Flu Like Symptoms   Victor [name] is a 33 y.o. male.   33 yo male with fever, left side sore throat x 3 days, now with right eye redness without drainage. Denies cough, sick contacts.  Seen here last week for urethral dc from soap, treated with doxy (not sexually active), didn't finish the rx.        Prior to Admission medications   Medication Sig Start Date End Date Taking? Authorizing Provider  azelastine  (ASTELIN ) 0.1 % nasal spray Place 2 sprays into both nostrils 2 (two) times daily. Use in each nostril as directed 08/12/23   Richad Jon HERO, NP  benzonatate  (TESSALON ) 100 MG capsule Take 1 capsule (100 mg total) by mouth 2 (two) times daily as needed for cough. 08/12/23   Kabbe, Angela M, NP  hydroxypropyl methylcellulose / hypromellose (ISOPTO TEARS / GONIOVISC) 2.5 % ophthalmic solution Place 1 drop into both eyes as needed for dry eyes.    [provider]  Melatonin 3 MG CAPS Take 1 capsule (3 mg total) by mouth at bedtime as needed (sleep). 06/10/18   Fawze, Mina A, PA-C    Allergies: Amoxicillin and Penicillins    Review of Systems Negative except as per HPI Updated Vital Signs BP (!) 147/86   Pulse 85   Temp 99.4 F (37.4 C) (Oral)   Resp 18   Ht 6' (1.829 m)   Wt 86.2 kg   SpO2 99%   BMI 25.77 kg/m   Physical Exam Vitals and nursing note reviewed.  Constitutional:      General: He is not in acute distress.    Appearance: He is well-developed. He is not diaphoretic.  HENT:     Head: Normocephalic and atraumatic.     Nose: Nose normal.     Mouth/Throat:     Mouth: Mucous membranes are moist.     Pharynx: No oropharyngeal exudate or posterior oropharyngeal erythema.  Eyes:     General:        Right eye: No discharge.        Left eye: No discharge.     Conjunctiva/sclera:     Right  eye: Right conjunctiva is injected.     Left eye: Left conjunctiva is not injected.  Cardiovascular:     Rate and Rhythm: Normal rate and regular rhythm.     Heart sounds: Normal heart sounds.  Pulmonary:     Effort: Pulmonary effort is normal.     Breath sounds: Normal breath sounds.  Musculoskeletal:     Cervical back: Neck supple.  Lymphadenopathy:     Cervical: No cervical adenopathy.  Skin:    General: Skin is warm and dry.     Findings: No erythema or rash.  Neurological:     Mental Status: He is alert and oriented to person, place, and time.  Psychiatric:        Behavior: Behavior normal.     (all labs ordered are listed, but only abnormal results are displayed) Labs Reviewed  GROUP A STREP BY PCR  RESP PANEL BY RT-PCR (RSV, FLU A&B, COVID)  [id number]    EKG: None  Radiology: No results found.   Procedures   Medications Ordered in the ED - No data to display  Medical Decision Making  33 year old male presents with concerns for fever, left-sided sore throat and right eye redness without drainage.  No sick contacts, no cough.  Patient seen here last year with concern for urethral discharge, not sexually active, STI panel negative.  He was treated with Doxy prophylactically, he did not finish the prescription.  Exam is unremarkable, patient is nontoxic and in no distress.  He is negative for COVID, flu, RSV and strep.  Suspect viral illness.  Recommend home management.  Return for worsening or concerning symptoms follow-up with PCP.  Labs reviewed from ER visit dated 04/22/2024.  HIV negative, RPR negative, GC chlamydia negative.    Final diagnoses:  Viral illness    ED Discharge Orders     None          Beverley Leita LABOR, PA-C 04/30/24 0113    Bari Charmaine FALCON, MD 05/01/24 [phone]

## 2024-05-17 ENCOUNTER — Telehealth: Payer: Self-pay | Admitting: Family

## 2024-05-17 DIAGNOSIS — K921 Melena: Secondary | ICD-10-CM

## 2024-05-17 DIAGNOSIS — K649 Unspecified hemorrhoids: Secondary | ICD-10-CM

## 2024-05-17 MED ORDER — HYDROCORTISONE ACETATE 25 MG RE SUPP: 25.0000 mg | Freq: Two times a day (BID) | RECTAL | 0 refills | Status: AC

## 2024-05-17 NOTE — Progress Notes (Signed)
 Virtual Visit Consent   Victor [name], you are scheduled for a virtual visit with a [facility] provider today. Just as with appointments in the office, your consent must be obtained to participate. Your consent will be active for this visit and any virtual visit you may have with one of our providers in the next 365 days. If you have a MyChart account, a copy of this consent can be sent to you electronically.  As this is a virtual visit, video technology does not allow for your provider to perform a traditional examination. This may limit your provider's ability to fully assess your condition. If your provider identifies any concerns that need to be evaluated in person or the need to arrange testing (such as labs, EKG, etc.), we will make arrangements to do so. Although advances in technology are sophisticated, we cannot ensure that it will always work on either your end or our end. If the connection with a video visit is poor, the visit may have to be switched to a telephone visit. With either a video or telephone visit, we are not always able to ensure that we have a secure connection.  By engaging in this virtual visit, you consent to the provision of healthcare and authorize for your insurance to be billed (if applicable) for the services provided during this visit. Depending on your insurance coverage, you may receive a charge related to this service.  I need to obtain your verbal consent now. Are you willing to proceed with your visit today? Breken A Grosser has provided verbal consent on 05/17/2024 for a virtual visit (video or telephone). Bari Learn, FNP  Date: 05/17/2024 9:38 AM   Virtual Visit via Video Note   I, Bari Learn, connected with  BERNICE [name]  ([id number], Feb 15, 1991) on 05/17/24 at  9:30 AM EST by a video-enabled telemedicine application and verified that I am speaking with the correct person using two identifiers.  Location: Patient: Virtual Visit  Location Patient: Home Provider: Virtual Visit Location Provider: Home Office   I discussed the limitations of evaluation and management by telemedicine and the availability of in person appointments. The patient expressed understanding and agreed to proceed.    History of Present Illness: Victor [name] is a 33 y.o. who identifies as a male who was assigned male at birth, and is being seen today for blood in stool for the last 6 days on and off. He was constipated and took a magnesium sulfate and after that he noticed the blood. He states he noticed bright blood and now a darker blood.   He denies any pain with BM, but has pain with gas. Reports he does feel a lump around his rectum.   HPI: HPI  Problems: There are no active problems to display for this patient.   Allergies:  Allergies  Allergen Reactions   Amoxicillin Other (See Comments)    Happened as a baby Has patient had a PCN reaction causing immediate rash, facial/tongue/throat swelling, SOB or lightheadedness with hypotension: Unknown Has patient had a PCN reaction causing severe rash involving mucus membranes or skin necrosis: Unknown Has patient had a PCN reaction that required hospitalization: Unknown Has patient had a PCN reaction occurring within the last 10 years: No If all of the above answers are NO, then may proceed with Cephalosporin use.    Penicillins    Medications:  Current Outpatient Medications:    hydrocortisone  (ANUSOL-HC) 25 MG suppository, Place 1 suppository (25 mg total) rectally  2 (two) times daily., Disp: 12 suppository, Rfl: 0   azelastine  (ASTELIN ) 0.1 % nasal spray, Place 2 sprays into both nostrils 2 (two) times daily. Use in each nostril as directed, Disp: 30 mL, Rfl: 0   benzonatate  (TESSALON ) 100 MG capsule, Take 1 capsule (100 mg total) by mouth 2 (two) times daily as needed for cough., Disp: 20 capsule, Rfl: 0   hydroxypropyl methylcellulose / hypromellose (ISOPTO TEARS / GONIOVISC)  2.5 % ophthalmic solution, Place 1 drop into both eyes as needed for dry eyes., Disp: , Rfl:    Melatonin 3 MG CAPS, Take 1 capsule (3 mg total) by mouth at bedtime as needed (sleep)., Disp: 20 capsule, Rfl: 0  Observations/Objective: Patient is well-developed, well-nourished in no acute distress.  Resting comfortably  at home.  Head is normocephalic, atraumatic.  No labored breathing.  Speech is clear and coherent with logical content.  Patient is alert and oriented at baseline.    Assessment and Plan: 1. Blood in stool (Primary)  2. Hemorrhoids, unspecified hemorrhoid type - hydrocortisone  (ANUSOL-HC) 25 MG suppository; Place 1 suppository (25 mg total) rectally 2 (two) times daily.  Dispense: 12 suppository; Refill: 0  Start hydrocortisone  suppository  Recommend to be seen in person for referral to GI for further investigation  Encourage stool softener  Force fluids  High fiber diet Follow up if symptoms worsen or do not improve   Follow Up Instructions: I discussed the assessment and treatment plan with the patient. The patient was provided an opportunity to ask questions and all were answered. The patient agreed with the plan and demonstrated an understanding of the instructions.  A copy of instructions were sent to the patient via MyChart unless otherwise noted below.     The patient was advised to call back or seek an in-person evaluation if the symptoms worsen or if the condition fails to improve as anticipated.    Bari Learn, FNP
